# Patient Record
Sex: Female | Born: 1950 | Race: White | Hispanic: No | Marital: Single | Smoking: Never smoker
Health system: Southern US, Community
[De-identification: ages and names within clinical notes are randomized; demographics above are authoritative.]

## PROBLEM LIST (undated history)

## (undated) DIAGNOSIS — K649 Unspecified hemorrhoids: Secondary | ICD-10-CM

## (undated) DIAGNOSIS — I6529 Occlusion and stenosis of unspecified carotid artery: Secondary | ICD-10-CM

## (undated) DIAGNOSIS — M1990 Unspecified osteoarthritis, unspecified site: Secondary | ICD-10-CM

## (undated) DIAGNOSIS — Z87898 Personal history of other specified conditions: Secondary | ICD-10-CM

## (undated) DIAGNOSIS — E119 Type 2 diabetes mellitus without complications: Secondary | ICD-10-CM

## (undated) DIAGNOSIS — F32A Depression, unspecified: Secondary | ICD-10-CM

## (undated) DIAGNOSIS — Z87440 Personal history of urinary (tract) infections: Secondary | ICD-10-CM

## (undated) DIAGNOSIS — I253 Aneurysm of heart: Secondary | ICD-10-CM

## (undated) DIAGNOSIS — F1010 Alcohol abuse, uncomplicated: Secondary | ICD-10-CM

## (undated) DIAGNOSIS — Z8619 Personal history of other infectious and parasitic diseases: Secondary | ICD-10-CM

## (undated) DIAGNOSIS — R55 Syncope and collapse: Secondary | ICD-10-CM

## (undated) DIAGNOSIS — F419 Anxiety disorder, unspecified: Secondary | ICD-10-CM

## (undated) DIAGNOSIS — I2699 Other pulmonary embolism without acute cor pulmonale: Secondary | ICD-10-CM

## (undated) DIAGNOSIS — I499 Cardiac arrhythmia, unspecified: Secondary | ICD-10-CM

## (undated) DIAGNOSIS — E785 Hyperlipidemia, unspecified: Secondary | ICD-10-CM

## (undated) DIAGNOSIS — C4339 Malignant melanoma of other parts of face: Secondary | ICD-10-CM

## (undated) DIAGNOSIS — J302 Other seasonal allergic rhinitis: Secondary | ICD-10-CM

## (undated) DIAGNOSIS — R569 Unspecified convulsions: Secondary | ICD-10-CM

## (undated) DIAGNOSIS — N200 Calculus of kidney: Secondary | ICD-10-CM

## (undated) DIAGNOSIS — F329 Major depressive disorder, single episode, unspecified: Secondary | ICD-10-CM

## (undated) DIAGNOSIS — I209 Angina pectoris, unspecified: Secondary | ICD-10-CM

## (undated) HISTORY — DX: Personal history of urinary (tract) infections: Z87.440

## (undated) HISTORY — DX: Other seasonal allergic rhinitis: J30.2

## (undated) HISTORY — DX: Cardiac arrhythmia, unspecified: I49.9

## (undated) HISTORY — DX: Depression, unspecified: F32.A

## (undated) HISTORY — PX: EYE SURGERY: SHX253

## (undated) HISTORY — DX: Occlusion and stenosis of unspecified carotid artery: I65.29

## (undated) HISTORY — DX: Aneurysm of heart: I25.3

## (undated) HISTORY — DX: Major depressive disorder, single episode, unspecified: F32.9

## (undated) HISTORY — DX: Personal history of other specified conditions: Z87.898

## (undated) HISTORY — DX: Hyperlipidemia, unspecified: E78.5

## (undated) HISTORY — DX: Calculus of kidney: N20.0

## (undated) HISTORY — DX: Alcohol abuse, uncomplicated: F10.10

## (undated) HISTORY — PX: COLONOSCOPY: SHX174

## (undated) HISTORY — PX: BREAST LUMPECTOMY: SHX2

## (undated) HISTORY — DX: Personal history of other infectious and parasitic diseases: Z86.19

---

## 1997-12-26 ENCOUNTER — Ambulatory Visit (HOSPITAL_COMMUNITY): Admission: RE | Admit: 1997-12-26 | Discharge: 1997-12-26 | Payer: Self-pay | Admitting: Family Medicine

## 1998-06-20 ENCOUNTER — Other Ambulatory Visit: Admission: RE | Admit: 1998-06-20 | Discharge: 1998-06-20 | Payer: Self-pay | Admitting: Family Medicine

## 1998-08-08 ENCOUNTER — Other Ambulatory Visit: Admission: RE | Admit: 1998-08-08 | Discharge: 1998-08-08 | Payer: Self-pay | Admitting: Obstetrics and Gynecology

## 1999-02-06 ENCOUNTER — Other Ambulatory Visit: Admission: RE | Admit: 1999-02-06 | Discharge: 1999-02-06 | Payer: Self-pay | Admitting: Obstetrics and Gynecology

## 1999-07-14 ENCOUNTER — Other Ambulatory Visit: Admission: RE | Admit: 1999-07-14 | Discharge: 1999-07-14 | Payer: Self-pay | Admitting: Obstetrics and Gynecology

## 1999-08-14 ENCOUNTER — Encounter: Payer: Self-pay | Admitting: Family Medicine

## 1999-08-14 ENCOUNTER — Encounter: Admission: RE | Admit: 1999-08-14 | Discharge: 1999-08-14 | Payer: Self-pay | Admitting: Family Medicine

## 1999-08-20 ENCOUNTER — Encounter: Payer: Self-pay | Admitting: Family Medicine

## 1999-08-20 ENCOUNTER — Encounter: Admission: RE | Admit: 1999-08-20 | Discharge: 1999-08-20 | Payer: Self-pay | Admitting: Family Medicine

## 2000-05-11 ENCOUNTER — Other Ambulatory Visit: Admission: RE | Admit: 2000-05-11 | Discharge: 2000-05-11 | Payer: Self-pay | Admitting: Obstetrics and Gynecology

## 2000-08-09 ENCOUNTER — Other Ambulatory Visit: Admission: RE | Admit: 2000-08-09 | Discharge: 2000-08-09 | Payer: Self-pay | Admitting: Obstetrics and Gynecology

## 2000-08-20 ENCOUNTER — Encounter: Payer: Self-pay | Admitting: Obstetrics and Gynecology

## 2000-08-20 ENCOUNTER — Encounter: Admission: RE | Admit: 2000-08-20 | Discharge: 2000-08-20 | Payer: Self-pay | Admitting: Obstetrics and Gynecology

## 2001-09-29 ENCOUNTER — Encounter: Payer: Self-pay | Admitting: Obstetrics and Gynecology

## 2001-09-29 ENCOUNTER — Encounter: Admission: RE | Admit: 2001-09-29 | Discharge: 2001-09-29 | Payer: Self-pay | Admitting: Obstetrics and Gynecology

## 2002-09-06 ENCOUNTER — Other Ambulatory Visit: Admission: RE | Admit: 2002-09-06 | Discharge: 2002-09-06 | Payer: Self-pay | Admitting: Obstetrics and Gynecology

## 2002-12-07 ENCOUNTER — Encounter: Admission: RE | Admit: 2002-12-07 | Discharge: 2002-12-07 | Payer: Self-pay | Admitting: Obstetrics and Gynecology

## 2002-12-07 ENCOUNTER — Encounter: Payer: Self-pay | Admitting: Obstetrics and Gynecology

## 2002-12-15 ENCOUNTER — Encounter: Payer: Self-pay | Admitting: Obstetrics and Gynecology

## 2002-12-15 ENCOUNTER — Encounter: Admission: RE | Admit: 2002-12-15 | Discharge: 2002-12-15 | Payer: Self-pay | Admitting: Obstetrics and Gynecology

## 2004-04-01 ENCOUNTER — Other Ambulatory Visit: Admission: RE | Admit: 2004-04-01 | Discharge: 2004-04-01 | Payer: Self-pay | Admitting: *Deleted

## 2004-04-09 ENCOUNTER — Encounter: Admission: RE | Admit: 2004-04-09 | Discharge: 2004-04-09 | Payer: Self-pay | Admitting: Obstetrics and Gynecology

## 2004-05-22 ENCOUNTER — Ambulatory Visit: Payer: Self-pay | Admitting: Cardiology

## 2004-05-22 ENCOUNTER — Inpatient Hospital Stay (HOSPITAL_COMMUNITY): Admission: EM | Admit: 2004-05-22 | Discharge: 2004-05-25 | Payer: Self-pay | Admitting: Family Medicine

## 2004-06-02 ENCOUNTER — Ambulatory Visit: Payer: Self-pay

## 2004-06-04 ENCOUNTER — Ambulatory Visit: Payer: Self-pay | Admitting: Cardiology

## 2004-07-10 ENCOUNTER — Ambulatory Visit: Payer: Self-pay | Admitting: Cardiology

## 2004-07-14 ENCOUNTER — Inpatient Hospital Stay (HOSPITAL_COMMUNITY): Admission: AD | Admit: 2004-07-14 | Discharge: 2004-07-17 | Payer: Self-pay | Admitting: Cardiology

## 2004-07-14 ENCOUNTER — Ambulatory Visit: Payer: Self-pay | Admitting: Cardiology

## 2004-07-14 ENCOUNTER — Ambulatory Visit: Payer: Self-pay

## 2004-07-16 ENCOUNTER — Ambulatory Visit: Payer: Self-pay | Admitting: Internal Medicine

## 2004-07-31 ENCOUNTER — Ambulatory Visit: Payer: Self-pay

## 2004-08-06 ENCOUNTER — Ambulatory Visit: Payer: Self-pay | Admitting: *Deleted

## 2004-08-29 ENCOUNTER — Ambulatory Visit: Payer: Self-pay | Admitting: Cardiology

## 2004-08-29 ENCOUNTER — Ambulatory Visit: Payer: Self-pay | Admitting: Internal Medicine

## 2004-09-24 ENCOUNTER — Ambulatory Visit: Payer: Self-pay

## 2004-10-10 ENCOUNTER — Ambulatory Visit: Payer: Self-pay

## 2004-10-20 ENCOUNTER — Ambulatory Visit: Payer: Self-pay | Admitting: Cardiology

## 2004-12-30 ENCOUNTER — Ambulatory Visit: Payer: Self-pay | Admitting: Cardiology

## 2005-03-09 ENCOUNTER — Encounter: Payer: Self-pay | Admitting: Cardiology

## 2005-03-09 ENCOUNTER — Ambulatory Visit: Payer: Self-pay

## 2005-03-18 ENCOUNTER — Ambulatory Visit: Payer: Self-pay | Admitting: Cardiology

## 2005-03-26 ENCOUNTER — Ambulatory Visit: Payer: Self-pay | Admitting: Cardiology

## 2005-05-14 ENCOUNTER — Ambulatory Visit: Payer: Self-pay | Admitting: Cardiology

## 2005-05-21 ENCOUNTER — Ambulatory Visit: Payer: Self-pay | Admitting: Cardiology

## 2005-10-20 ENCOUNTER — Ambulatory Visit: Payer: Self-pay | Admitting: Cardiology

## 2006-04-01 ENCOUNTER — Ambulatory Visit: Payer: Self-pay | Admitting: Cardiology

## 2006-04-08 ENCOUNTER — Encounter: Payer: Self-pay | Admitting: Cardiology

## 2006-04-08 ENCOUNTER — Ambulatory Visit: Payer: Self-pay

## 2006-04-12 ENCOUNTER — Ambulatory Visit: Payer: Self-pay | Admitting: Cardiology

## 2006-11-11 ENCOUNTER — Ambulatory Visit: Payer: Self-pay | Admitting: Cardiology

## 2006-11-11 LAB — CONVERTED CEMR LAB
ALT: 31 units/L (ref 0–35)
AST: 42 units/L — ABNORMAL HIGH (ref 0–37)
Bilirubin, Direct: 0.1 mg/dL (ref 0.0–0.3)
Cholesterol: 189 mg/dL (ref 0–200)
HDL: 48.6 mg/dL (ref >39.0)
Total Bilirubin: 1.1 mg/dL (ref 0.3–1.2)
VLDL: 38 mg/dL (ref 0–40)

## 2006-11-18 ENCOUNTER — Ambulatory Visit: Payer: Self-pay | Admitting: Cardiology

## 2006-11-18 LAB — CONVERTED CEMR LAB: Digitoxin Lvl: 0.2 ng/mL — ABNORMAL LOW (ref 0.8–2.0)

## 2007-05-12 ENCOUNTER — Ambulatory Visit: Payer: Self-pay | Admitting: Cardiology

## 2007-05-12 LAB — CONVERTED CEMR LAB
ALT: 34 units/L (ref 0–35)
AST: 42 units/L — ABNORMAL HIGH (ref 0–37)
LDL Cholesterol: 100 mg/dL — ABNORMAL HIGH (ref 0–99)
Total Bilirubin: 1.1 mg/dL (ref 0.3–1.2)

## 2007-05-19 ENCOUNTER — Encounter: Admission: RE | Admit: 2007-05-19 | Discharge: 2007-05-19 | Payer: Self-pay | Admitting: Internal Medicine

## 2007-05-19 ENCOUNTER — Ambulatory Visit: Payer: Self-pay | Admitting: Cardiology

## 2007-11-24 ENCOUNTER — Ambulatory Visit: Payer: Self-pay | Admitting: Cardiology

## 2007-11-24 LAB — CONVERTED CEMR LAB
ALT: 25 units/L (ref 0–35)
AST: 33 units/L (ref 0–37)
Alkaline Phosphatase: 68 units/L (ref 39–117)
LDL Cholesterol: 105 mg/dL — ABNORMAL HIGH (ref 0–99)
Total Bilirubin: 0.7 mg/dL (ref 0.3–1.2)
Total CHOL/HDL Ratio: 4.3
VLDL: 33 mg/dL (ref 0–40)

## 2007-11-30 ENCOUNTER — Ambulatory Visit: Payer: Self-pay | Admitting: Cardiology

## 2008-04-06 DIAGNOSIS — C4339 Malignant melanoma of other parts of face: Secondary | ICD-10-CM

## 2008-04-06 HISTORY — DX: Malignant melanoma of other parts of face: C43.39

## 2008-04-06 HISTORY — PX: MELANOMA EXCISION: SHX5266

## 2008-05-31 ENCOUNTER — Ambulatory Visit: Payer: Self-pay

## 2008-05-31 ENCOUNTER — Encounter: Payer: Self-pay | Admitting: Cardiology

## 2008-05-31 ENCOUNTER — Ambulatory Visit: Payer: Self-pay | Admitting: Cardiology

## 2008-05-31 LAB — CONVERTED CEMR LAB
AST: 42 units/L — ABNORMAL HIGH (ref 0–37)
Alkaline Phosphatase: 67 units/L (ref 39–117)
Bilirubin, Direct: 0.1 mg/dL (ref 0.0–0.3)
HDL: 56.4 mg/dL (ref >39.0)
Total Bilirubin: 0.7 mg/dL (ref 0.3–1.2)
Total Protein: 6.8 g/dL (ref 6.0–8.3)
Triglycerides: 156 mg/dL — ABNORMAL HIGH (ref 0–149)
VLDL: 31 mg/dL (ref 0–40)

## 2008-06-07 ENCOUNTER — Ambulatory Visit: Payer: Self-pay | Admitting: Cardiology

## 2008-08-07 ENCOUNTER — Encounter: Payer: Self-pay | Admitting: Cardiology

## 2008-12-06 ENCOUNTER — Encounter (INDEPENDENT_AMBULATORY_CARE_PROVIDER_SITE_OTHER): Payer: Self-pay | Admitting: *Deleted

## 2009-01-31 ENCOUNTER — Telehealth: Payer: Self-pay | Admitting: Cardiology

## 2009-02-11 ENCOUNTER — Telehealth: Payer: Self-pay | Admitting: Cardiology

## 2009-03-20 DIAGNOSIS — Z8659 Personal history of other mental and behavioral disorders: Secondary | ICD-10-CM

## 2009-03-20 DIAGNOSIS — F1010 Alcohol abuse, uncomplicated: Secondary | ICD-10-CM | POA: Insufficient documentation

## 2009-03-20 DIAGNOSIS — Z8669 Personal history of other diseases of the nervous system and sense organs: Secondary | ICD-10-CM | POA: Insufficient documentation

## 2009-03-20 DIAGNOSIS — E7800 Pure hypercholesterolemia, unspecified: Secondary | ICD-10-CM

## 2009-03-20 DIAGNOSIS — Z8679 Personal history of other diseases of the circulatory system: Secondary | ICD-10-CM

## 2009-03-26 ENCOUNTER — Ambulatory Visit: Payer: Self-pay | Admitting: Cardiology

## 2009-03-28 LAB — CONVERTED CEMR LAB
ALT: 26 units/L (ref 0–35)
AST: 41 units/L — ABNORMAL HIGH (ref 0–37)
Albumin: 3.9 g/dL (ref 3.5–5.2)
Cholesterol: 187 mg/dL (ref 0–200)
LDL Cholesterol: 90 mg/dL (ref 0–99)
Total Protein: 6.9 g/dL (ref 6.0–8.3)
Triglycerides: 152 mg/dL — ABNORMAL HIGH (ref 0.0–149.0)

## 2009-04-02 ENCOUNTER — Ambulatory Visit: Payer: Self-pay | Admitting: Cardiology

## 2009-04-25 ENCOUNTER — Encounter (INDEPENDENT_AMBULATORY_CARE_PROVIDER_SITE_OTHER): Payer: Self-pay | Admitting: *Deleted

## 2009-05-14 ENCOUNTER — Encounter (INDEPENDENT_AMBULATORY_CARE_PROVIDER_SITE_OTHER): Payer: Self-pay | Admitting: *Deleted

## 2009-05-16 ENCOUNTER — Ambulatory Visit: Payer: Self-pay | Admitting: Internal Medicine

## 2009-05-30 ENCOUNTER — Ambulatory Visit: Payer: Self-pay | Admitting: Internal Medicine

## 2009-11-22 ENCOUNTER — Ambulatory Visit: Payer: Self-pay | Admitting: Cardiology

## 2009-11-28 ENCOUNTER — Ambulatory Visit: Payer: Self-pay | Admitting: Cardiology

## 2009-11-28 DIAGNOSIS — I4891 Unspecified atrial fibrillation: Secondary | ICD-10-CM

## 2009-11-29 LAB — CONVERTED CEMR LAB
AST: 32 units/L (ref 0–37)
Albumin: 4.1 g/dL (ref 3.5–5.2)
HDL: 61 mg/dL (ref >39.00)
LDL Cholesterol: 98 mg/dL (ref 0–99)
Total CHOL/HDL Ratio: 3
Triglycerides: 155 mg/dL — ABNORMAL HIGH (ref 0.0–149.0)
VLDL: 31 mg/dL (ref 0.0–40.0)

## 2010-04-27 ENCOUNTER — Encounter: Payer: Self-pay | Admitting: Internal Medicine

## 2010-05-06 NOTE — Assessment & Plan Note (Signed)
Summary: f59m   Visit Type:  6 months follow up  CC:  No cardiac complains.  History of Present Illness: She is doing well.  She is taking her medications.  No specific problems.  Still not taking good care of herself.   She has a therapist.  Seeing Dr. Raquel James, who is being strict.  No chest pain.  No clinical suggestion of being out of rhythm.  Current Medications (verified): 1)  Flecainide Acetate 50 Mg Tabs (Flecainide Acetate) .... Take 2 Tablets Twice Daily 2)  Digoxin 0.125 Mg Tabs (Digoxin) .... Take One Tablet By Mouth Daily 3)  Crestor 20 Mg Tabs (Rosuvastatin Calcium) .... Take One-Half  Tablet By Mouth Daily. 4)  Aspirin Ec 325 Mg Tbec (Aspirin) .... Take One Tablet By Mouth Daily 5)  Multivitamins  Tabs (Multiple Vitamin) .... Take 1 Tablet By Mouth Once A Day 6)  Dilt-Cd 240 Mg Xr24h-Cap (Diltiazem Hcl Coated Beads) .... Take 1 Capsule By Mouth Once A Day 7)  Sertraline Hcl 50 Mg Tabs (Sertraline Hcl) .... Take 1 Tablet By Mouth Once A Day  Allergies: 1)  ! * Cephalosporins  Vital Signs:  Patient profile:   60 year old female Height:      63 inches Weight:      162.75 pounds BMI:     28.93 Pulse rate:   71 / minute Pulse rhythm:   regular Resp:     18 per minute BP sitting:   122 / 70  (left arm) Cuff size:   large  Vitals Entered By: Vikki Ports (November 28, 2009 3:57 PM)  Physical Exam  General:  Well developed, well nourished, in no acute distress. Head:  normocephalic and atraumatic Lungs:  Clear bilaterally to auscultation and percussion. Heart:  NormalS1 and S2.  S4.   Msk:  Back normal, normal gait. Muscle strength and tone normal. Pulses:  pulses normal in all 4 extremities Extremities:  No clubbing or cyanosis. Neurologic:  Alert and oriented x 3.   EKG  Procedure date:  11/28/2009  Findings:      NSR with first degree av block.  Possible LAE.  QTc 453.  Impression & Recommendations:  Problem # 1:  ATRIAL FIBRILLATION (ICD-427.31) no  obvious recurrence.  Maintaining meds without difficulty, tolerating without symptoms Her updated medication list for this problem includes:    Flecainide Acetate 50 Mg Tabs (Flecainide acetate) .Marland Kitchen... Take 2 tablets twice daily    Digoxin 0.125 Mg Tabs (Digoxin) .Marland Kitchen... Take one tablet by mouth daily    Aspirin Ec 325 Mg Tbec (Aspirin) .Marland Kitchen... Take one tablet by mouth daily  Her updated medication list for this problem includes:    Flecainide Acetate 50 Mg Tabs (Flecainide acetate) .Marland Kitchen... Take 2 tablets twice daily    Digoxin 0.125 Mg Tabs (Digoxin) .Marland Kitchen... Take one tablet by mouth daily    Aspirin Ec 325 Mg Tbec (Aspirin) .Marland Kitchen... Take one tablet by mouth daily  Problem # 2:  HYPERCHOLESTEROLEMIA (ICD-272.0)  labs revieiwed. Her updated medication list for this problem includes:    Crestor 20 Mg Tabs (Rosuvastatin calcium) .Marland Kitchen... Take one-half  tablet by mouth daily.  Her updated medication list for this problem includes:    Crestor 20 Mg Tabs (Rosuvastatin calcium) .Marland Kitchen... Take one-half  tablet by mouth daily.  Problem # 3:  ALCOHOL ABUSE (ICD-305.00) still has been a bit of an issue. See psychiatry for this.     Other Orders: EKG w/ Interpretation (93000)  Patient Instructions: 1)  Your physician recommends that you continue on your current medications as directed. Please refer to the Current Medication list given to you today. 2)  Your physician wants you to follow-up in:  6 months. You will receive a reminder letter in the mail two months in advance. If you don't receive a letter, please call our office to schedule the follow-up appointment.

## 2010-05-06 NOTE — Procedures (Signed)
Summary: Colonoscopy  Patient: [name] Note: All result statuses are Final unless otherwise noted.  Tests: (1) Colonoscopy (COL)   COL Colonoscopy           DONE     [facility]     [address]     [place], [place]  [address]           COLONOSCOPY PROCEDURE REPORT           PATIENT:  Traci, [name]  MR#:  [id number]     BIRTHDATE:  [birth year], 58 yrs. old  GENDER:  female           ENDOSCOPIST:  Hedwig Morton. Juanda Chance, MD     Referred by:  Raechel Chute, M.D.           PROCEDURE DATE:  05/30/2009     PROCEDURE:  Colonoscopy 19147     ASA CLASS:  Class I     INDICATIONS:  Routine Risk Screening           MEDICATIONS:   Versed 4 mg, Fentanyl 50 mcg           DESCRIPTION OF PROCEDURE:   After the risks benefits and     alternatives of the procedure were thoroughly explained, informed     consent was obtained.  Digital rectal exam was performed and     revealed no rectal masses.   The LB [id number] endoscope     was introduced through the anus and advanced to the cecum, which     was identified by both the appendix and ileocecal valve, without     limitations.  The quality of the prep was good, using MiraLax.     The instrument was then slowly withdrawn as the colon was fully     examined.     <<PROCEDUREIMAGES>>           FINDINGS:  No polyps or cancers were seen (see image1, image2,     image3, and image5).  Mild diverticulosis was found (see image4).     only one diverticulum seen in the left colon   Retroflexed views     in the rectum revealed no abnormalities.    The scope was then     withdrawn from the patient and the procedure completed.           COMPLICATIONS:  None           ENDOSCOPIC IMPRESSION:     1) No polyps or cancers     2) Mild diverticulosis     RECOMMENDATIONS:     1) High fiber diet.           REPEAT EXAM:  In 10 year(s) for.           ______________________________     Hedwig Morton. Juanda Chance, MD           CC:            n.     eSIGNED:   Hedwig Morton. Artice Bergerson at 05/30/2009 09:40 AM           Theora Master, [phone]  Note: An exclamation mark (!) indicates a result that was not dispersed into the flowsheet. Document Creation Date: 05/30/2009 10:36 AM _______________________________________________________________________  (1) Order result status: Final Collection or observation date-time: 05/30/2009 09:35 Requested date-time:  Receipt date-time:  Reported date-time:  Referring Physician:   Ordering Physician: Lina Sar [phone]) Specimen Source:  Source: Launa Grill Order Number: [id number]  Lab site:   Appended Document: Colonoscopy    Clinical Lists Changes  Observations: Added new observation of COLONNXTDUE: 05/2019 (05/30/2009 11:18)

## 2010-05-06 NOTE — Letter (Signed)
Summary: Previsit letter  [facility]  [address] [place], [place] [address]   Phone: [phone]  Fax: [phone]       04/25/2009 MRN: [id number]  [name] [address] [place], [place]  [address]  Dear [name],  Welcome to the Gastroenterology Division at [facility].    You are scheduled to see a nurse for your pre-procedure visit on 05-16-09 at 10:00a.m. on the 3rd floor at [facility], [address]  We ask that you try to arrive at our office 15 minutes prior to your appointment time to allow for check-in.  Your nurse visit will consist of discussing your medical and surgical history, your immediate family medical history, and your medications.    Please bring a complete list of all your medications or, if you prefer, bring the medication bottles and we will list them.  We will need to be aware of both prescribed and over the counter drugs.  We will need to know exact dosage information as well.  If you are on blood thinners (Coumadin, Plavix, Aggrenox, Ticlid, etc.) please call our office today/prior to your appointment, as we need to consult with your physician about holding your medication.   Please be prepared to read and sign documents such as consent forms, a financial agreement, and acknowledgement forms.  If necessary, and with your consent, a friend or relative is welcome to sit-in on the nurse visit with you.  Please bring your insurance card so that we may make a copy of it.  If your insurance requires a referral to see a specialist, please bring your referral form from your primary care physician.  No co-pay is required for this nurse visit.     If you cannot keep your appointment, please call [phone] to cancel or reschedule prior to your appointment date.  This allows [place] the opportunity to schedule an appointment for another patient in need of care.    Thank you for choosing [facility] for your  medical needs.  We appreciate the opportunity to care for you.  Please visit [place] at our website  to learn more about our practice.                     Sincerely.                                                                                                                   The Gastroenterology Division

## 2010-05-06 NOTE — Miscellaneous (Signed)
Summary: [facility] Previsit/prep  Clinical Lists Changes  Medications: Added new medication of MIRALAX   POWD (POLYETHYLENE GLYCOL 3350) As per prep  instructions. - Signed Added new medication of METOCLOPRAMIDE HCL 10 MG  TABS (METOCLOPRAMIDE HCL) As per prep instructions. - Signed Added new medication of DULCOLAX 5 MG  TBEC (BISACODYL) Day before procedure take 2 at 3pm and 2 at 8pm. - Signed Rx of MIRALAX   POWD (POLYETHYLENE GLYCOL 3350) As per prep  instructions.;  #255gm x 0;  Signed;  Entered by: Wyona Almas RN;  Authorized by: Hart Carwin MD;  Method used: Electronically to [facility]*, [address], [place], [place]  [address], Ph: [phone], Fax: [phone] Rx of METOCLOPRAMIDE HCL 10 MG  TABS (METOCLOPRAMIDE HCL) As per prep instructions.;  #2 x 0;  Signed;  Entered by: Wyona Almas RN;  Authorized by: Hart Carwin MD;  Method used: Electronically to [facility]*, [address] [place], [place]  [address], Ph: [phone], Fax: [phone] Rx of DULCOLAX 5 MG  TBEC (BISACODYL) Day before procedure take 2 at 3pm and 2 at 8pm.;  #4 x 0;  Signed;  Entered by: Wyona Almas RN;  Authorized by: Hart Carwin MD;  Method used: Electronically to [facility]*, [address], [place], [place]  [id number], Ph: [phone], Fax: [phone] Allergies: Added new allergy or adverse reaction of SULFA Observations: Added new observation of NKA: F (05/16/2009 9:43)    Prescriptions: DULCOLAX 5 MG  TBEC (BISACODYL) Day before procedure take 2 at 3pm and 2 at 8pm.  #4 x 0   Entered by:   Wyona Almas RN   Authorized by:   Hart Carwin MD   Signed by:   Wyona Almas RN on 05/16/2009   Method used:   Electronically to        [facility]* (retail)       [address]       [place], [place]  [id number]       Ph: [phone]       Fax: [phone]   RxID:   [id number] METOCLOPRAMIDE HCL 10 MG  TABS (METOCLOPRAMIDE  HCL) As per prep instructions.  #2 x 0   Entered by:   Wyona Almas RN   Authorized by:   Hart Carwin MD   Signed by:   Wyona Almas RN on 05/16/2009   Method used:   Electronically to        [facility]* (retail)       [address]       [place], [place]  [address]       Ph: [phone]       Fax: [phone]   RxID:   [id number] MIRALAX   POWD (POLYETHYLENE GLYCOL 3350) As per prep  instructions.  #255gm x 0   Entered by:   Wyona Almas RN   Authorized by:   Hart Carwin MD   Signed by:   Wyona Almas RN on 05/16/2009   Method used:   Electronically to        [facility]* (retail)       [address]       [place], [place]  [address]       Ph: [phone]       Fax: [phone]   RxID:   [id number]

## 2010-05-06 NOTE — Letter (Signed)
Summary: [facility] Instructions  [facility]  [address] [place], [place] [address]   Phone: [phone]  Fax: [phone]       [name]    Mar 13, 1951    MRN: [id number]       Procedure Day /Date:  Thursday 05/30/09     Arrival Time:   8:00am     Procedure Time:  9:00am     Location of Procedure:                    [name]  [facility] (4th Floor)    PREPARATION FOR COLONOSCOPY WITH MIRALAX  Starting 5 days prior to your procedure   Saturday 02/19  do not eat nuts, seeds, popcorn, corn, beans, peas,  salads, or any raw vegetables.  Do not take any fiber supplements (e.g. Metamucil, Citrucel, and Benefiber). ____________________________________________________________________________________________________   THE DAY BEFORE YOUR PROCEDURE         DATE:  02/23  DAY: Wednesday  1   Drink clear liquids the entire day-NO SOLID FOOD  2   Do not drink anything colored red or purple.  Avoid juices with pulp.  No orange juice.  3   Drink at least 64 oz. (8 glasses) of fluid/clear liquids during the day to prevent dehydration and help the prep work efficiently.  CLEAR LIQUIDS INCLUDE: Water Jello Ice Popsicles Tea (sugar ok, no milk/cream) Powdered fruit flavored drinks Coffee (sugar ok, no milk/cream) Gatorade Juice: apple, white grape, white cranberry  Lemonade Clear bullion, consomm, broth Carbonated beverages (any kind) Strained chicken noodle soup Hard Candy  4   Mix the entire bottle of Miralax with 64 oz. of Gatorade/Powerade in the morning and put in the refrigerator to chill.  5   At 3:00 pm take 2 Dulcolax/Bisacodyl tablets.  6   At 4:30 pm take one Reglan/Metoclopramide tablet.  7  Starting at 5:00 pm drink one 8 oz glass of the Miralax mixture every 15-20 minutes until you have finished drinking the entire 64 oz.  You should finish drinking prep around 7:30 or 8:00 pm.  8   If you are nauseated, you may take the 2nd  Reglan/Metoclopramide tablet at 6:30 pm.        9    At 8:00 pm take 2 more DULCOLAX/Bisacodyl tablets.     THE DAY OF YOUR PROCEDURE      DATE:   02/24  DAY: Thursday  You may drink clear liquids until  7:00am   (2 HOURS BEFORE PROCEDURE).   MEDICATION INSTRUCTIONS  Unless otherwise instructed, you should take regular prescription medications with a small sip of water as early as possible the morning of your procedure.    Additional medication instructions:  Be sure to take morning medications the morning of procedure.         OTHER INSTRUCTIONS  You will need a responsible adult at least 60 years of age to accompany you and drive you home.   This person must remain in the waiting room during your procedure.  Wear loose fitting clothing that is easily removed.  Leave jewelry and other valuables at home.  However, you may wish to bring a book to read or an iPod/MP3 player to listen to music as you wait for your procedure to start.  Remove all body piercing jewelry and leave at home.  Total time from sign-in until discharge is approximately 2-3 hours.  You should go home directly after your procedure  and rest.  You can resume normal activities the day after your procedure.  The day of your procedure you should not:   Drive   Make legal decisions   Operate machinery   Drink alcohol   Return to work  You will receive specific instructions about eating, activities and medications before you leave.   The above instructions have been reviewed and explained to me by   Traci Almas RN  February 60, 2011 10:25 AM     I fully understand and can verbalize these instructions _____________________________ Date _______

## 2010-06-18 ENCOUNTER — Ambulatory Visit (INDEPENDENT_AMBULATORY_CARE_PROVIDER_SITE_OTHER): Payer: BC Managed Care – PPO | Admitting: Cardiology

## 2010-06-18 ENCOUNTER — Encounter: Payer: Self-pay | Admitting: Cardiology

## 2010-06-18 DIAGNOSIS — Z8679 Personal history of other diseases of the circulatory system: Secondary | ICD-10-CM

## 2010-06-18 DIAGNOSIS — E7800 Pure hypercholesterolemia, unspecified: Secondary | ICD-10-CM

## 2010-06-18 DIAGNOSIS — I4891 Unspecified atrial fibrillation: Secondary | ICD-10-CM

## 2010-07-03 NOTE — Assessment & Plan Note (Signed)
Summary: f/u  ov   Visit Type:  Follow-up  CC:  no complaints.  History of Present Illness: She says she is doing well.  Still not as good as alcohol should be, but still drinks too much.  She does not smoke.  She does not exercise at all really.  She sits way too much.  Denies dizziness, and denies chest pain.    Problems Prior to Update: 1)  Atrial Fibrillation  (ICD-427.31) 2)  Encounter For Long-term Use of Other Medications  (ICD-V58.69) 3)  Atrial Fibrillation, Hx of  (ICD-V12.59) 4)  Hypercholesterolemia  (ICD-272.0) 5)  Vertigo, Hx of  (ICD-V12.49) 6)  Depression, Hx of  (ICD-V11.8) 7)  Alcohol Abuse  (ICD-305.00)  Current Medications (verified): 1)  Flecainide Acetate 50 Mg Tabs (Flecainide Acetate) .... Take 2 Tablets Twice Daily 2)  Digoxin 0.125 Mg Tabs (Digoxin) .... Take One Tablet By Mouth Daily 3)  Crestor 20 Mg Tabs (Rosuvastatin Calcium) .... Take One-Half  Tablet By Mouth Daily. 4)  Aspirin Ec 325 Mg Tbec (Aspirin) .... Take One Tablet By Mouth Daily 5)  Multivitamins  Tabs (Multiple Vitamin) .... Take 1 Tablet By Mouth Once A Day 6)  Dilt-Cd 240 Mg Xr24h-Cap (Diltiazem Hcl Coated Beads) .... Take 1 Capsule By Mouth Once A Day 7)  Sertraline Hcl 100 Mg Tabs (Sertraline Hcl) .Marland Kitchen.. 1 Tablet Daily  Allergies (verified): 1)  ! * Cephalosporins  Past History:  Past Medical History: Last updated: 03/20/2009 Current Problems:  ATRIAL FIBRILLATION, HX OF (ICD-V12.59) HYPERCHOLESTEROLEMIA (ICD-272.0) VERTIGO, HX OF (ICD-V12.49) DEPRESSION, HX OF (ICD-V11.8) ALCOHOL ABUSE (ICD-305.00) .Atrial septal aneurysm  Past Surgical History: Last updated: 03/20/2009  None.  Family History: Last updated: 03/20/2009  Her mother is alive and has no history of heart  disease but does have a pacemaker.  Her father died in his 49s, but she thinks it may have been heart disease.  She has a sister with no history of heart disease.  Social History: Last updated: 03/20/2009  She lives alone in [place] and works at the [employer]. She does not smoke and never has. She does not use drugs. She states she drinks beer daily and goes through about two cases a week.  Vital Signs:  Patient profile:   60 year old female Height:      63 inches Weight:      161.50 pounds BMI:     28.71 Pulse rate:   67 / minute BP sitting:   108 / 64  (left arm) Cuff size:   regular  Vitals Entered By: Caralee Ates CMA (June 18, 2010 9:50 AM)  Physical Exam  General:  Well developed, well nourished, in no acute distress. Head:  normocephalic and atraumatic Eyes:  PERRLA/EOM intact; conjunctiva and lids normal. Neck:  No JVD Msk:  Scoliosis. reviewed with patient in detail. Extremities:  No clubbing or cyanosis. Neurologic:  Alert and oriented x 3.   EKG  Procedure date:  06/18/2010  Findings:      NSR.  First degree av block.  QTc is normal.    Impression & Recommendations:  Problem # 1:  ATRIAL FIBRILLATION (ICD-427.31)  maintaining NSR on Flecainide.  Will do routine GXT to exclude ischemia.   Her updated medication list for this problem includes:    Flecainide Acetate 50 Mg Tabs (Flecainide acetate) .Marland Kitchen... Take 2 tablets twice daily    Digoxin 0.125 Mg Tabs (Digoxin) .Marland Kitchen... Take one tablet by mouth daily    Aspirin Ec  325 Mg Tbec (Aspirin) .Marland Kitchen... Take one tablet by mouth daily  Orders: EKG w/ Interpretation (93[address])  Her updated medication list for this problem includes:    Flecainide Acetate 50 Mg Tabs (Flecainide acetate) .Marland Kitchen... Take 2 tablets twice daily    Digoxin 0.125 Mg Tabs (Digoxin) .Marland Kitchen... Take one tablet by mouth daily    Aspirin Ec 325 Mg Tbec (Aspirin) .Marland Kitchen... Take one tablet by mouth daily  Problem # 2:  HYPERCHOLESTEROLEMIA (ICD-272.0) on treatment.  Not at target. Uses alcohol and has had problems with borderline LFT in past.   Will leave as at present.   Her updated medication list for this problem includes:    Crestor 20 Mg Tabs (Rosuvastatin  calcium) .Marland Kitchen... Take one-half  tablet by mouth daily.  Problem # 3:  DEPRESSION, HX OF (ICD-V11.8) Continues to seek counseling for issues, including alcohol use.  She freely admits it is still a problem, says she perhaps at times gets lonely.  We continue to discuss this.    Patient Instructions: 1)  Your physician recommends that you continue on your current medications as directed. Please refer to the Current Medication list given to you today. 2)  Your physician has requested that you have an exercise tolerance test in 6 MONTHS.  For further information please visit [email or web address]  Please also follow instruction sheet, as given.

## 2010-07-08 ENCOUNTER — Other Ambulatory Visit: Payer: Self-pay | Admitting: *Deleted

## 2010-07-08 MED ORDER — FLECAINIDE ACETATE 50 MG PO TABS: ORAL_TABLET | ORAL | Status: DC

## 2010-08-13 ENCOUNTER — Other Ambulatory Visit: Payer: Self-pay | Admitting: Cardiology

## 2010-08-19 NOTE — Assessment & Plan Note (Signed)
[facility]                            CARDIOLOGY OFFICE NOTE   Traci, [name]                    MRN:          [id number]  DATE:06/07/2008                            DOB:          03/23/1951    [name] is in for followup.  She has not been out of rhythm, and  she has not had any chest pain.  She thinks she is doing better with her  alcohol to some extent and she has been placed on an antidepressant by  Dr. Raquel James.  Otherwise, she is free of significant symptoms.   MEDICATIONS:  1. Aspirin 325 mg daily.  2. Digoxin 0.125 mg daily.  3. Multivitamin.  4. Flecainide 50 mg 2 tablets p.o. b.i.d.  5. Crestor 20 mg daily.  6. Cardizem CD 240 mg daily.  7. Sertraline 50 mg daily.   On physical, she is alert and oriented in no distress.  Weight is 164  pounds up 2 pounds from the last office visit in August.  Blood pressure  is 122/68, the pulse is 72.  The lung fields actually are relatively  clear.  There is no demonstrable edema.   Last echocardiogram in January 2008 suggested an atrial septal aneurysm.  Repeat echocardiogram at this time does not so much change with an  overall ejection fraction of 55-65%.  Right ventricular size and  function was thought to be normal.   Today's electrocardiogram demonstrates normal sinus rhythm, QT interval  was 435 milliseconds, the PR interval was 218 milliseconds.   IMPRESSION:  1. Hypercholesterolemia on lipid-lowering therapy.  2. History of atrial fibrillation.  3. Atrial septal aneurysm on aspirin.   PLAN:  1. Return to clinic in 6 months to 1 year.  2. Continue counsel regarding alcohol intake.     Arturo Morton. Riley Kill, MD, [facility]  Electronically Signed    TDS/MedQ  DD: 06/17/2008  DT: 06/18/2008  Job #: [id number]

## 2010-08-19 NOTE — Assessment & Plan Note (Signed)
[facility]                            CARDIOLOGY OFFICE NOTE   DERIONNA, SALVADOR                    MRN:          [id number]  DATE:11/18/2006                            DOB:          July 23, 1950    Ms. [name] is in for follow-up.  From a cardiac standpoint, she really  has gotten along well.  She is continuing to exercise on a regular  basis.  She has not been having any major symptoms.  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  We did have a frank  discussion today about some of her habits.  She clearly understands that  she needs to exercise more.  She says that she drinks on more days than  not, sometimes up to five to six beers an evening.  I have had a long  thorough discussion about that and I have encouraged her to seek some  resolution to this over time whether it be with help or other type of  intervention.  She is coherent and clearly has the understanding to  understand this.   CURRENT MEDICATIONS:  1. Aspirin 325 mg daily.  2. Digoxin 0.125 mg daily.  3. Effexor 137.5 mg p.o. b.i.d.  4. Multivitamin daily.  5. Flecainide 50 mg two tablets b.i.d.  6. Crestor 10 mg nightly.  7. Cardizem 240 mg daily.   PHYSICAL EXAMINATION:  VITAL SIGNS:  Blood pressure 124/70, pulse 84.  GENERAL APPEARANCE:  She is an alert and oriented female in no acute  distress.  LUNGS:  The lung fields are clear.  CARDIOVASCULAR:  Regular rhythm.  There is a soft systolic ejection  murmur with no diastolic murmurs.  EXTREMITIES:  No edema.   LABORATORY DATA:  Recent laboratory studies include an LDL with  cholesterol 102, triglycerides 190 and total cholesterol 189.   The patient's electrocardiogram demonstrates normal sinus rhythm with  first degree AV block.  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  PR interval has not changed substantially and was 206  msec on the previous tracing and the intervals  have otherwise not  changed as well.   IMPRESSION:  1. History of atrial fibrillation currently on flecainide maintenance      prophylaxis.  2. Hypercholesterolemia on lipid lowering therapy with desire not to      change dose on the part of the patient.  3. Frequent alcohol use with thorough discussion regarding the above.   PLAN:  1. Return to clinic in six months.  2. Digoxin and flecainide levels.  3. Discussion regarding her cholesterol lowering agents, what the      targets are and what the long-term plan is.     Arturo Morton. Riley Kill, MD, [facility]  Electronically Signed    TDS/MedQ  DD: 11/18/2006  DT: 11/19/2006  Job #: [id number]

## 2010-08-19 NOTE — Assessment & Plan Note (Signed)
[facility]                            CARDIOLOGY OFFICE NOTE   [name], [name]                    MRN:          [id number]  DATE:05/19/2007                            DOB:          [birth year]    Ms. Rendall is in for follow-up.  In general she has been stable.  She  sees Dr. Constance Goltz at [facility].  She is also seeing Dr. Jamas Lav for adjustment of her other medications.  She denies any ongoing  chest pain or progressive shortness of breath.  We continue discuss  today some of her habits.   CURRENT MEDICATIONS:  Aspirin 325 mg daily, digoxin 0.125 mg daily,  multivitamin daily, flecainide 50 mg 2 tablets b.i.d., Crestor 10 mg  q.h.s. and Cardizem CD 240 mg daily.   PHYSICAL EXAMINATION:  She is alert and oriented in no distress.  Blood pressure is 114/75, the pulse is 70.  The weight is 171 pounds,  which is slightly less than the past.  Her cardiac rhythm is regular.  There is a soft systolic ejection murmur  as previously noted.  No diastolic murmurs are appreciated.   The electrocardiogram demonstrates normal sinus rhythm with first-degree  AV block.  There is possible left atrial enlargement and a rightward  oriented axis.  There is a low voltage QRS.   Importantly, the patient has had prior echocardiography.  The last  echocardiogram in January 2008 revealed normal LV function.  Right-sided  structures were not significantly increased.  There was a suggestion of  an atrial septal aneurysm.   IMPRESSION:  1. Paroxysmal atrial fibrillation on flecainide.  2. Hypercholesterolemia on lipid lowering therapy.   RECOMMENDATIONS:  1. We will continue to follow the patient.  2. She will need periodic echocardiography.  3. I have continued to counsel her use of alcohol intake.  No      definitive evidence of any type of left-to-right shunt, although      this cannot be entirely excluded.     Arturo Morton. Riley Kill, MD, [facility]  Electronically Signed    TDS/MedQ  DD: 05/29/2007  DT: 05/29/2007  Job #: [id number]

## 2010-08-19 NOTE — Assessment & Plan Note (Signed)
[facility]                            CARDIOLOGY OFFICE NOTE   Nelda, Luckey ALLYSEN LAZO                    MRN:          [id number]  DATE:11/30/2007                            DOB:          [birth year]    Ms. Kuwahara is in for followup.  In general, she is stable.  She has  not been having any ongoing chest pain or progressive shortness of  breath.  She does continue to use a little bit of alcohol at night.  She  had been on antidepressants since Christmas at which time, she had kind  of a difficult time, but she says that her alcohol content had lessened,  and she feels overall improved.  School has restarted this week.   CURRENT MEDICATIONS:  Include:  1. Aspirin 325 mg daily.  2. Digoxin 0.125 mg daily.  3. Multivitamin 1 daily.  4. Flecainide 50 mg 2 tablets p.o. b.i.d.  5. Crestor 20 mg nightly.  6. Cardizem CD 240 mg daily.  7. Sertraline 50 mg daily.   PHYSICAL EXAMINATION:  GENERAL:  She is alert and oriented, in no  distress.  VITAL SIGNS:  Blood pressure is 120/70, pulse is 68.  LUNGS:  Lung fields are clear.  CARDIAC:  Reveals a normal first and second heart sound.  There is a  minimal systolic ejection murmur.   The electrocardiogram demonstrates sinus rhythm with borderline first-  degree AV block, PR interval of 230 milliseconds, the QTC is 438.  No  definite acute changes are noted.   The patient received electrophysiology.  She has paroxysmal atrial  fibrillation and has had flutter as well but is on flecainide.  She has  hypercholesterolemia but is currently on lipid-lowering therapy.  She  has had an issue with alcohol use in the past which we have tried to  encourage her to diminish.  We will see her back in followup in 6  months, and at that time, a repeat echocardiogram will be recommended.     Arturo Morton. Riley Kill, MD, [facility]  Electronically Signed    TDS/MedQ  DD: 11/30/2007  DT: 12/01/2007  Job #: [id number]

## 2010-08-19 NOTE — Assessment & Plan Note (Signed)
[facility]                            CARDIOLOGY OFFICE NOTE   ISSA, LUSTER                    MRN:          [id number]  DATE:06/07/2008                            DOB:          11-18-1950    [name] is in for followup.  In general, she is doing extremely  well.  She denies any ongoing chest pain or shortness of breath.  She  has been on antidepressants, and as a result working with her  counselors.  Her alcohol use is less; however, it has not completely  gone and she is using some nonalcoholic beer from time to time now.   Repeat 2-D echocardiogram was done this week.  EF was 55-65% and right-  sided structures were normal.  The right ventricle was not visualized  well however, but size was felt to be normal.  Of note, there was  thought to be an atrial septal aneurysm.  The atrial septal aneurysm was  seen on 2-D.   In addition, she had lipid and liver done.  Liver functions were for the  most part normal, although the SGOT was minimally elevated at 42,  cholesterols 203, triglycerides 156, and direct LDL 124.   PHYSICAL EXAMINATION:  GENERAL:  Today, she is alert and oriented.  No  distress.  She is pleasant.  VITAL SIGNS:  Her weight is 164, up 2 pounds from the previous visit.  Blood pressure 122/68 and pulse 72.  LUNGS:  Fields are clear.  CARDIAC:  Rhythm was regular with a minimal systolic ejection murmur.   The electrocardiogram demonstrates sinus rhythm with a first-degree AV  block with PR intervals 218 msec, QTC is 435.   IMPRESSION:  1. History of paroxysmal atrial fibrillation, currently controlled on      flecainide.  2. Hypercholesterolemia, on lipid-lowering therapy, not at target.  3. 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  4. History of alcohol use.   PLAN:  1. Return to clinic in 6 months.  2. Continue current medical regimen.  3. I reemphasized efforts to quit.  4. Continue  long-term therapy with aspirin given the patient's atrial      fib and atrial septal aneurysm.     Arturo Morton. Riley Kill, MD, [facility]  Electronically Signed   TDS/MedQ  DD: 06/07/2008  DT: 06/07/2008  Job #: [id number]

## 2010-08-22 NOTE — Assessment & Plan Note (Signed)
[facility]                            CARDIOLOGY OFFICE NOTE   GRISSEL, TYRELL                    MRN:          [id number]  DATE:04/12/2006                            DOB:          09/07/1950    Ms. Nuzzo is in for a followup visit.  To briefly summarize, she is  getting along reasonably well.  She has been stable, but she has gone  back to some of her old habits, and these are of concern.   Her current medications include:  1. Aspirin 325 mg daily.  2. Digoxin 0.125 mg daily.  3. Effexor 37 mg p.o. b.i.d.  4. Multivitamin daily.  5. Flecainide 50 mg 2 tablets b.i.d.  6. Crestor 10 mg nightly.  7. Cardizem CD 240 mg daily.   PHYSICAL EXAMINATION:  Her weight is 175 pounds, that is up slightly.  The blood pressure is 136/86, and the pulse is 89.  LUNG FIELDS:  Are really quite clear.  CARDIAC:  Rhythm is regular currently.  The patient had an echocardiogram.  This demonstrated normal systolic  function.  There was atrioseptal aneurysm, which she and I discussed.  There was no pericardial effusion, and the right-sided structures  appeared to be relatively normal.   The electrocardiogram demonstrates normal sinus rhythm with borderline  1st degree A-V block.  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  There is borderline prolonged QT.   IMPRESSION:  1. History of atrial fibrillation, currently treated with flecainide      and aspirin prophylaxis.  2. Significant hypercholesterolemia, on lipid lowering therapy.   PLAN:  Return to clinic in 6 months in followup.     Arturo Morton. Riley Kill, MD, [facility]  Electronically Signed    TDS/MedQ  DD: 07/29/2006  DT: 07/29/2006  Job #: [id number]

## 2010-08-22 NOTE — H&P (Signed)
NAMEYAMILETH, HAYSE NO.:  [id number]   MEDICAL RECORD NO.:  [id number]          PATIENT TYPE:  INP   LOCATION:  2103                         FACILITY:  [facility]   PHYSICIAN:  Arturo Morton. Riley Kill, M.D. [facility] OF BIRTH:  1950-06-04   DATE OF ADMISSION:  05/22/2004  DATE OF DISCHARGE:                                HISTORY & PHYSICAL   CHIEF COMPLAINT:  Dizziness/atrial fibrillation with rapid ventricular  response.   HISTORY OF PRESENT ILLNESS:  Ms. Macapagal is a 60 year old female with a no  known history of coronary artery disease. She had been having presyncopal  spells for about a month that had been increasing in frequency recently. Ms.  Rhudy saw her primary care physician. During his exam her heart rate was  noted to be elevated and irregular. An EKG revealed atrial fibrillation with  rapid ventricular response. She was sent to the emergency room and evaluated  by cardiology here for admission.   Ms. Brazie states that the dizziness episodes have been increasing in  frequency for the last two weeks. They started about a month ago. She states  that the room spins, that she has no loss of vision or hearing. She has not  fallen. She does not get weak unilaterally or bilaterally. She feels that  she can induce the symptoms by tilting her head back or to the left. She has  no history of syncope. She had vertigo about 10 or 15 years ago, but these  symptoms are not the same and at that time she was treated successfully with  Antivert. She has occasional chest pain which she describes as brief and as  a catch in her chest. There is no association with exertion, presyncope,  or palpitations. Of note, she has not had any palpitations. The chest pain  episodes she has had for several years and they have not changed recently.   PAST MEDICAL HISTORY:  Borderline hyperlipidemia, but she has not had this  checked recently. She states that her blood pressure runs on  the low side.  She has a history of depression. She had a history of vertigo 10 to 15 years  ago, treated with Antivert.   PAST SURGICAL HISTORY:  None.   ALLERGIES:  CEPHALOSPORINS.   MEDICATIONS:  Effexor-XR 75 mg daily.   SOCIAL HISTORY:  She lives alone in [place] and works at the [employer]. She does not smoke and never has. She does not use drugs. She  states she drinks beer daily and goes through about two cases a week.   FAMILY HISTORY:  Her mother is alive at age 82 and has no history of heart  disease, but has a pacemaker. Her father died in his 72s and she is not sure  what he died of, but she thinks it was heart disease. There were estranged.  She has one sister who has no history of heart disease.   REVIEW OF SYSTEMS:  She denies any fever, chills, or illnesses. She has rare  chest pain as described above. She has no history of palpitations and is not  aware that her heart beat is rapid or irregular. She has had vaginal  bleeding in the recent past. She states she had no periods for over a year  and then had four days of vaginal bleeding in January, but none since. She  states that saw her physician and is to get a D&C.  She has occasional  arthralgias. She denies any GI symptoms. She has no history of thyroid  disease.   PHYSICAL EXAMINATION:  VITAL SIGNS: Temperature 98.6, blood pressure 100/70,  pulse rate 158, O2 saturation 97% on two liters. Repeat blood pressure  116/83 manually.  GENERAL: She is a well-developed, slightly obese white female in no acute  distress.  HEENT: Her head is normocephalic and atraumatic with pupils equal, round,  and reactive to light and accommodation. Extraocular movements intact.  Sclerae clear. Nares are without discharge.  NECK: There is no lymphadenopathy, thyromegaly, bruit, or JVD noted.  CV: Her heart is rapid and irregular with an S1 and S2. No significant  murmurs, rubs, or gallops appreciated. Her distal pulses are  2+ and no  femoral bruits are appreciated.  LUNGS: Clear to auscultation bilaterally.  SKIN: No rashes or lesions are noted.  ABDOMEN: Soft and nontender with active bowel sounds and no  hepatosplenomegaly by palpation.  EXTREMITIES: There is no clubbing, cyanosis, or edema.  MUSCULOSKELETAL: There is no joint ejection fraction or effusions and no  spinal or CVA tenderness.  NEUROLOGIC: She is alert and oriented with cranial nerves II-XII grossly  intact.   EKG reveals a rate of 162, atrial fibrillation with rapid ventricular  response, and no acute ischemic changes. There is no old EKG available for  comparison. Chest x-ray and labs are pending.   ASSESSMENT/PLAN:  1.  Atrial fibrillation with rapid ventricular response. Will attempt to      decrease the heart rate with IV Cardizem. We will use IV fluids p.r.n.      to keep the systolic blood pressure greater than 95. Cardizem will be      changed to p.o. once she has stabilized. Will also use digoxin.  2.  Dizziness: She has no carotid bruits, but it is associated with head      tilt. Will check carotid Dopplers.  3.  Anticoagulation: Will add aspirin to her medication regimen and heparin      if her PT/PTT are within normal limits. Use of Coumadin will be      determined once she has been fully evaluated.  4.  Thyroid function studies have been ordered and are pending at the time      of dictation. Will also check two sets of cardiac enzymes, although she      has no specific ischemic symptoms.   Dr. Shawnie Pons saw the patient and determined the plan of care.      RB/MEDQ  D:  05/22/2004  T:  05/22/2004  Job:  [id number]

## 2010-08-22 NOTE — Assessment & Plan Note (Signed)
[facility]                              CARDIOLOGY OFFICE NOTE   MARIAEDUARDA, DEFRANCO                    MRN:          [id number]  DATE:10/20/2005                            DOB:          1950/04/08    Ms. Haughton is in for a followup visit.  To briefly summarize, she is  stable.  She denies any ongoing chest pain.  She has not had what sounds  like recurrent atrial fibrillation.  She has had a repeat echocardiogram.  Right ventricular size was upper normal.  Pulmonary artery was normal.  Pulmonary artery systolic pressure was thought to be normal.  The inferior  vena cava was only mildly elevated, but respirophasic changes were felt to  be normal as well.  She does not have overwhelming shortness of breath,  although she is not particularly active.  She remains on flecainide for  control of her atrial fibrillation.  She also has significant  hypercholesterolemia, but no coronary artery disease that is documented.   On physical examination today, the blood pressure is 125/75, the pulse is  80.  The lung fields are clear and the cardiac rhythm is regular.  There is  not a significant systolic ejection murmur nor a diastolic murmur.  It is  difficult to tell whether P2 has increased much because of her body habitus.  There is no extremity edema.  The jugular veins are not distended.   The EKG reveals normal sinus rhythm with first-degree A-V block.  There is a  rightward-oriented axis and nonspecific T wave abnormality.   IMPRESSION:  1.  History of atrial fibrillation, now controlled on aspirin prophylaxis.  2.  Significant hypercholesterolemia.  3.  Daily use of flecainide.   PLAN:  1.  Repeat lipid and liver profile.  2.  Repeat echocardiogram after the first of the year.  3.  Return to clinic in 6 months.                              Arturo Morton. Riley Kill, MD, [facility]    TDS/MedQ  DD:  10/20/2005  DT:  10/21/2005  Job #:  [id number]

## 2010-08-22 NOTE — Discharge Summary (Signed)
[name], [name] NO.:  [id number]   MEDICAL RECORD NO.:  [id number]          PATIENT TYPE:  INP   LOCATION:  3735                         FACILITY:  [facility]   PHYSICIAN:  [name], P.A. DATE OF BIRTH:  11-Dec-1950   DATE OF ADMISSION:  07/14/2004  DATE OF DISCHARGE:  07/17/2004                                 DISCHARGE SUMMARY   DISCHARGE DIAGNOSES:  1.  Admitted with typical atrial flutter, rapid ventricular rate, with rate      control and conversion after increase in Cardizem from 240 mg daily to      360 mg daily.  2.  Maintain on IV heparin throughout this hospitalization.  3.  Transesophageal echocardiogram on July 15, 2004. No left ventricular or      left atrial thrombus. No right atrial thrombus. Left atrial enlargement.  4.  On July 16, 2004, DC cardioversion. The patient converted to atrial      fibrillation, was given flecainide 300 mg times one dose and then      converted to sinus rhythm and maintained in sinus rhythm.  5.  PLAN: Home April 13th on aspirin only. No Lovenox and no Coumadin. The      patient has no thromboembolic risk factors such as a hypertension,      tobacco use, prior coronary artery disease, diabetes.  6.  Exercise Cardiolite is set for April 27th at 7:45 a.m.  7.  Initiate flecainide therapy at the April 27th visit at the discretion of      [name].  8.  Her atrial fibrillation is well controlled with flecainide and if the      patient then has breakout atrial flutter, atrial flutter ablation will      be considered.   SECONDARY DIAGNOSES:  1.  Presyncope times one month on presentation, May 22, 2004, to [facility] emergency room with finding of atrial fibrillation, rapid      ventricular rate. Started on Cardizem and digoxin.  2.  Ethanol usage.   PROCEDURE:  1.  On July 15, 2004, transesophageal echocardiogram. No intramural      thrombus noted.  2.  On July 16, 2004, DC  cardioversion. The patient converted to atrial      fibrillation with subsequent pharmacologic conversion to sinus rhythm on      flecainide 300 mg times one dose.   DISCHARGE DISPOSITION:  [name] is discharged on April 13th, hospital  day four. She is discharged in sinus rhythm. She has not had any other  cardiac issues such as pain or shortness of breath. At the time of discharge  the patient had received two doses of Coumadin, but her INR was not elevated  and was 1.0. She will not receive any further doses at this time. The  patient is in sinus rhythm with a regular rate on auscultation with a grade  II/XI murmur.   The patient is discharged on the following medications:  1.  Enteric-coated aspirin 325 mg daily.  2.  Cardizem 360 mg daily, this is a new  dose.  3.  Digoxin 0.125 mg daily.  4.  Multivitamin daily.  5.  Effexor 75 mg daily.   DISCHARGE DIET:  Low sodium, low cholesterol diet.   She follows up at the [facility] on [address] for an  exercise stress test on Thursday, July 31, 2004, at 7:45 a.m. and then she  will see [name] or his physician assistant at 11:15 on April 27th. She  is asked to eat nothing after midnight on Wednesday, April 26th. Once again,  the patient will not be discharged on any anticoagulation.   BRIEF HISTORY:  [name] is a 60 year old female admitted by [name]  on May 22, 2004, after a one month history of presyncopal spells. She  was found on admission to be in atrial fibrillation with rapid ventricular  response. She had an episode of room spinning which were increasing in  frequency. She feels she can induce her symptoms by tilting her head back  and to the left. Carotid Dopplers in February were negative for severe  internal carotid artery stenosis the left showed a 40% to 60% internal  carotid artery stenosis and the right was free of internal carotid artery  stenosis. Her symptoms are not the  same on this February presentation as her  vertigo symptoms which she had about 10 to 15 years ago and were treated  successfully with Antivert. She has an occasional feeling of a catch in  her chest. No overt pain, no awareness of palpitations, no frank syncope.  She is not short of breath and not weak. She is treated to sinus rhythm with  Cardizem and digoxin. Her TSH at that time was 1.440. Echocardiogram was not  done. Echocardiogram is to be done as an outpatient. The echocardiogram done  which was done on March 6th showed results with an EF of 50% to 55%, trace  mitral regurgitation, chamber sizes unremarkable. An exercise study was then  arranged. The patient presented on April 10th. When hooked up to the  electrocardiogram it was noted that she was in atrial flutter.  Electrocardiograms have accompanied the patient. She has no cognition that  her heart rate is irregular, no palpitation, no shortness of breath, no  chest discomfort. She has not been dizzy either. She felt bad some days  prior to this visit, a little weird in the chest, but not dizzy. Collee took  her pulse and was not racing. She knew that she had the appointment coming  up on April 10th and wanted to wait until coming in then. Once gain,  electrocardiogram on this admission shows typical atrial flutter rate in the  150s.  She presents for admission and electrophysiology consult.   HOSPITAL COURSE:  Presenting to [facility] on April 10th with  finding of atrial flutter with rapid ventricular response in the 150s. This  is a typical flutter. She was treated with her regular medications of  Cardizem 240 and digoxin 0.125 mg. She was also given IV Cardizem in  addition and converted to sinus rhythm. She was then converted to increasing  oral doses, increased from 240 to 360, and maintaining sinus rhythm. She  underwent a transesophageal echocardiogram on April 11th and the results  have been  Dictation ended  at this point.      GM/MEDQ  D:  07/17/2004  T:  07/17/2004  Job:  [id number]   cc:   Arturo Morton. Riley [name], M.D. [facility] C.  Andrey Campanile, M.D.  [address]  [place]  [place] [address]  Fax: [phone]

## 2010-08-22 NOTE — Discharge Summary (Signed)
NAMETRINE, FREAD NO.:  [id number]   MEDICAL RECORD NO.:  [id number]          PATIENT TYPE:  INP   LOCATION:  2002                         FACILITY:  [facility]   PHYSICIAN:  Charlton Haws, M.D.     DATE OF BIRTH:  September 20, 1950   DATE OF ADMISSION:  05/22/2004  DATE OF DISCHARGE:  05/25/2004                           DISCHARGE SUMMARY - REFERRING   SUMMARY OF HISTORY:  Ms. [name] is a 60 year old female who describes  presyncopal episodes for the preceding month that have been increasing in  frequency.  She saw her primary care physician and during this examination,  her heart rate was noted to be elevated near regular, thus she was referred  to the emergency room.  An EKG noted her to be in atrial fibrillation with a  rapid ventricular response.  She states that these episodes are increasing  in frequency and associate that with dizziness as well as room spinning.  She has not had any actual loss of consciousness or visual disturbances.  She also describes what sounds like vertigo about 15 years ago and feels  that these are not similar episodes.  Her history is notable for borderline  hyperlipidemia, depression and alcohol use, about two cases of beer per  week.  She was admitted for further evaluation.  Chest x-ray on admission  showed prominent heart size, no active disease.  Admission H&H was 14.0,  39.6, normal indices, platelets 218, wbc 9.3.  Subsequent hematologies were  unremarkable.  On May 25, 2004, prior to discharge, H&H was 13.3 and  37.5, normal indices, platelets 208, wbc 7.4.  On admission, PTT was 26, PT  12.2.  Sodium 141, potassium 3.6, BUN 11, creatinine 0.7, glucose 106.  Normal LFTs.  There were no subsequent chemistries.  CK-MBs x2 and one  troponin were negative for myocardial infarction.  Fasting lipids on  May 23, 2004, showed total cholesterol 241, triglycerides 253, HDL 47,  LDL 143.  TSH was 1.440.   EKG on admission showed  atrial fibrillation with a ventricular rate of 162.  Subsequent EKGs continued to show atrial fibrillation, however, rate was  slower.  At the time of discharge, the patient was in normal sinus rhythm  with PACs.   HOSPITAL COURSE:  Ms. Elsbury was admitted to unit 2000.  Dr. Riley Kill  ordered digoxin, Cardizem, an echocardiogram  and to have her thyroid  checked as well as IV heparin.  Overnight, she remained in atrial  fibrillation, however, ventricular rate was slightly better controlled.  Dr.  Riley Kill did not she was having short runs of normal sinus rhythm and did not  feel cardioversion was an option.  Her IV Cardizem was changed to p.o.  Cardizem.  Carotid Doppler were also performed and did not show any right  ICA stenosis.  She does have a left 40 to 60% ICA stenosis.  In fact,  morphology did not support velocities.  Bilateral vertebral flow antegrade.  She continued to have some intermittent atrial fibrillation on telemetry  monitor according to the nursing staff.  By May 24, 2004, Dr. Samule Ohm  reviewed, felt  that she was not at a high risk for stroke and would favor  aspirin therapy at this time if her rate remains controlled.  He felt that  she could be discharged Sunday with an outpatient echocardiogram .  In  regard to her ETOH abuse, he stated that she needs to discontinue this and  recommended Librium if she was dedicated to this option.  On May 25, 2004, this was reviewed by Dr. Eden Emms and he felt that she could be  discharged home.  At the time of discharge she was in normal sinus rhythm.   DISCHARGE DIAGNOSES:  1.  Paroxysmal atrial fibrillation with a rapid ventricular rate.  2.  Alcohol use.  3.  History as previously.   DISPOSITION:  She was given permission to continue Effexor XR 75 mg daily.  She received prescriptions for Cardizem CD 240 daily, aspirin 325 daily,  digoxin 0.125 daily, Librium 25 mg t.i.d. (after the Librium.  She only  received a  one month supply).  She was given permission to return to work on  Monday.  Maintain low salt, fat,  cholesterol diet.  She was instructed no alcohol.  She was asked to call our  office on Monday to arrange a follow-up echocardiogram  and a two week  appointment with Dr. Riley Kill.  She was also asked to follow up with Dr.  Andrey Campanile in regard to her alcohol and Librium prescription.      EW/MEDQ  D:  05/25/2004  T:  05/26/2004  Job:  [id number]   cc:   Vale Haven. Andrey Campanile, M.D.  [address]  [place]  [place] [address]  Fax: [phone]

## 2010-08-22 NOTE — Discharge Summary (Signed)
NAMELEYTON, BROWNLEE NO.:  [id number]   MEDICAL RECORD NO.:  [id number]          PATIENT TYPE:  INP   LOCATION:  3735                         FACILITY:  [facility]   PHYSICIAN:  Arturo Morton. Riley Kill, M.D. [facility] OF BIRTH:  Aug 07, 1950   DATE OF ADMISSION:  07/14/2004  DATE OF DISCHARGE:  07/17/2004                                 DISCHARGE SUMMARY   DISCHARGE DIAGNOSES:  1.  Admitted this hospitalization with typical atrial flutter with rapid      ventricular rate converting to sinus rhythm with an increase of Cardizem      from 240 mg daily which she had been taking to 360 mg.  2.  IV heparin throughout this hospitalization.  3.  Transesophageal echocardiogram  July 15, 2004 without finding of any      intramural thrombus or left atrial enlargement.  4.  Discontinued cardioversion July 16, 2004 with cardioversion to atrial      fibrillation.  Subsequent conversion to sinus rhythm after having been      given Flecainide 300 mg x1 dose.  5.  This is a PLAN:  Home July 17, 2004 on aspirin only, no Lovenox, no      Coumadin.  6.  Exercise Cardiolite study set for July 31, 2004 at 7:45 in the morning.  7.  Initiate Flecainide therapy July 31, 2004 visit after the discretion of      Dr. Shawnie Pons.  8.  If atrial fibrillation or basic rhythm is controlled on Flecainide and      if the patient then has breakthrough to atrial flutter,      electrophysiology will consider atrial flutter ablation.   SECONDARY DIAGNOSES:  1.  Presyncope x1 month upon admission May 22, 2004 to [facility] emergency room.  Finding of atrial fibrillation, rapid      ventricular rate.  Started on Cardizem and Digoxin.  2.  History of ethanol use.  3.  Dyslipidemia.  4.  Vertigo.   PROCEDURE:  1.  Transesophageal echocardiogram July 15, 2004.  No intramural thrombus.  2.  July 16, 2004 discontinue cardioversion.  Converting to atrial      fibrillation.  Subsequent  pharmacologic conversion to sinus rhythm on      Flecainide.  Remains in sinus rhythm 16 hours postconversion.   DISCHARGE DISPOSITION:  The patient discharging July 17, 2004.  She is in  sinus rhythm achieving 93% oxygen saturation on room air.  She has had two  doses of Coumadin but her INR is only 1.0 at the time of discharge.  She  will not be taking Coumadin at home and her IV heparin will be discontinued.  She is achieving 93% oxygen saturation on room air.  She has had no chest  pain, no dyspnea, actually no dizziness either, she is very relaxed.  She  will have the follow up as dictated.  Exercise stress test on July 31, 2004  with possible initiation of Flecainide therapy at that time and watchful  waiting for possible breakthrough of atrial flutter.  The patient discharged  on the following medications.   DISCHARGE MEDICATIONS:  1.  A new dose of Cardizem 360 mg daily.  2.  Enteric-coated aspirin 325 mg daily.  3.  Digoxin 0.125 mg daily.  4.  Multivitamin daily.  5.  Effexor 75 mg daily.   DISPOSITION:  Once again follow up with [facility] on July 30, 2004, [place].  Exercise stress test Thursday, July 31, 2004 at  7:45 in the morning.  She is asked to eat nothing after midnight Wednesday,  July 30, 2004.  To see Dr. Riley Kill or his P.A. at 11:15 in the morning on  July 31, 2004.   HISTORY OF PRESENT ILLNESS:  Ms. Charisa Twitty is a 60 year old female  admitted by Dr. Riley Kill, May 22, 2004 with a one month history of  presyncopal spells.  At that time she was found to have atrial fibrillation  rapid ventricular rate.  Her symptoms were room spinning which was  increasing in frequency.  She feels can induce the symptoms by tilting her  head back and to the left.  Carotid Dopplers were done at that time showing  no significant internal carotid artery stenosis.  Her symptoms are not the  same as her vertigo.  She had this 10-15 years ago and  it was successfully  treated with Antivert.  She has the occasional feeling of a catch in her  chest, no overt pain.  She  has no awareness of palpitations, no frank  syncope, she is not short of breath, not weak.  She was treated in sinus  rhythm with Cardizem and Digoxin in February.  Her TSH at that time was  1.440.  Echocardiogram was not done, it was scheduled for June 19, 2004 and  it showed ejection fraction of 50-55%.  She had trace mitral regurgitation.  The patient was then scheduled for an exercise Cardiolite study on July 14, 2004.  She presented the day of this admission.  She presented to the office  of [facility], hooked up to electrocardiogram and was noted to be in  atrial flutter.  Her electrocardiograms are accompanying the patient.  She  has no cognition of irregular rate, no feeling of palpitations, shortness of  breath or chest discomfort.  She has not been dizzy at all either.  She did  feel bad last Thursday, a little weird in the chest although not dizzy.  She asked a colleague to take her pulse.  It was not racing.  She new she  had the appointment coming up at July 14, 2004 and waited to come in until  then.  Electrocardiogram one again July 14, 2004 shows atrial flutter  typical pattern rate in the 150s.   HOSPITAL COURSE:  The patient presenting from the office of [facility] on July 14, 2004 in atrial flutter with rapid rate.  She converted  with increase in Cardizem on a IV Cardizem drip.  Her Cardizem was increased  from 240 to 360 and she has maintained sinus rhythm throughout the remainder  of her hospitalization.  It is not felt that she need anticoagulation  therapy.  She has been maintained on IV heparin throughout this  hospitalization.  It was planned at one time that she would go home on  Lovenox bridging to Coumadin, this was not felt to be necessary.  The patient goes home with the medications and follow up as  dictated.      GM/MEDQ  D:  07/17/2004  T:  07/17/2004  Job:  [id number]   cc:   Duke Salvia, M.D.   [place] C. Andrey Campanile, M.D.  [address]  [place]  [place] [address]  Fax: [phone]

## 2010-08-22 NOTE — H&P (Signed)
NAMETALA, EBER NO.:  [id number]   MEDICAL RECORD NO.:  [id number]          PATIENT TYPE:  INP   LOCATION:                               FACILITY:  [facility]   PHYSICIAN:  Arturo Morton. Riley Kill, M.D. [facility] OF BIRTH:  [birth year]   DATE OF ADMISSION:  07/14/2004  DATE OF DISCHARGE:                                HISTORY & PHYSICAL   CHIEF COMPLAINT:  I feel okay.   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 She had  been having some presyncopal spells for about a month and was subsequently  admitted to the hospital in mid February with what appeared to be atrial  fibrillation with rapid ventricular response. She was seen in the emergency  room and subsequently admitted. With admission to the hospital,   Dictation ended at this point.       ___________________________________________  Arturo Morton. Riley Kill, M.D. [facility]    TDS/MEDQ  D:  07/14/2004  T:  07/14/2004  Job:  [id number]

## 2010-08-22 NOTE — H&P (Signed)
NAMEPASHA, Traci NO.:  [id number]   MEDICAL RECORD NO.:  [id number]          PATIENT TYPE:  INP   LOCATION:                               FACILITY:  [facility]   PHYSICIAN:  Arturo Morton. Riley Kill, M.D. [facility] OF BIRTH:  08-13-1950   DATE OF ADMISSION:  07/14/2004  DATE OF DISCHARGE:                                HISTORY & PHYSICAL   CHIEF COMPLAINT:  I feel okay.   HISTORY OF PRESENT ILLNESS:  Ms. Arciniega is a delightful 60 year old who  had been having some presyncopal spells and was admitted in early February  with atrial fibrillation with rapid ventricular response.  She eventually  converted and was subsequently discharged with a daily aspirin.  She was  noted to have a left 40-60% ICA stenosis.  The atrial fibrillation converted  and she has generally been doing pretty well.  She subsequently had an  outpatient echocardiogram.  This echocardiogram demonstrated low normal EF  but otherwise unremarkable study.  There did not appear to be significant  valvular disease.  There was mild mitral annular calcification.  In  addition, laboratory studies revealed normal hemoglobin, hematocrit, and  platelet count.  Her glucose was borderline.  CPKs were unremarkable.  Cholesterols were elevated with a cholesterol of 241 and an LDL of 143.  TSH  was normal at that time.  It was 1.4.  She has been feeling relatively well  but noticed the other day that her rhythm might be fast.  She is on Cardizem  and Lanoxin to try to keep her rate under control.   PAST MEDICAL HISTORY:  1.  Borderline hyperlipidemia.  2.  History of depression.  3.  History of vertigo 10-15 years ago.   PAST SURGICAL HISTORY:  None.   ALLERGIES:  CEPHALOSPORINS.   MEDICATIONS:  Effexor XR 75 mg daily.   SOCIAL HISTORY:  She lives alone and works at [employer].  She does not  smoke and does not use drugs.  She does drink some beer and has had in the  past.   FAMILY HISTORY:  Her mother is  alive at age 47 and has no history of heart  disease but does have a pacemaker.  Her father died in his 36s, but she  thinks it may have been heart disease.  She has a sister with no history of  heart disease.   REVIEW OF SYSTEMS:  She denies fever, chills, or other major problems.   PHYSICAL EXAMINATION:  GENERAL:  On physical examination today, she is an  alert, oriented female.  There is no acute distress.  HEENT:  Examination is unremarkable.  LUNGS:  The lung fields are clear to auscultation and percussion.  CARDIAC:  Rhythm reveals no murmur.  The cardiac rhythm is irregularly  irregular.  ABDOMEN:  Soft.  EXTREMITIES:  No significant edema.   DIAGNOSTIC STUDIES:  Electrocardiogram demonstrates atrial fibrillation with  rapid ventricular response.  There is a right-ward oriented axis.  The EKG  is compatible with typical flutter waves.   IMPRESSION:  1.  Recurrent atrial arrhythmias this time characterized  by atrial flutter      with rapid ventricular response despite medications.  2.  Hyperlipidemia.  3.  History of carotid stenosis.   PLAN:  1.  Admit to hospital.  2.  Rate control.  3.  Intravenous heparin.  4.  Consultation with electrophysiology service.       ___________________________________________  Arturo Morton Riley Kill, M.D. [facility]    TDS/MEDQ  D:  07/14/2004  T:  07/14/2004  Job:  [id number]

## 2010-11-05 ENCOUNTER — Other Ambulatory Visit: Payer: Self-pay | Admitting: *Deleted

## 2010-11-06 ENCOUNTER — Other Ambulatory Visit: Payer: Self-pay | Admitting: *Deleted

## 2010-11-06 MED ORDER — FLECAINIDE ACETATE 50 MG PO TABS: ORAL_TABLET | ORAL | Status: DC

## 2010-11-27 ENCOUNTER — Other Ambulatory Visit: Payer: Self-pay | Admitting: Cardiology

## 2010-12-09 ENCOUNTER — Encounter: Payer: BC Managed Care – PPO | Admitting: Physician Assistant

## 2010-12-12 ENCOUNTER — Encounter: Payer: Self-pay | Admitting: Physician Assistant

## 2010-12-15 ENCOUNTER — Ambulatory Visit (INDEPENDENT_AMBULATORY_CARE_PROVIDER_SITE_OTHER): Payer: BC Managed Care – PPO | Admitting: Physician Assistant

## 2010-12-15 DIAGNOSIS — I4891 Unspecified atrial fibrillation: Secondary | ICD-10-CM

## 2010-12-15 NOTE — Progress Notes (Signed)
Exercise Treadmill Test  Pre-Exercise Testing Evaluation Rhythm: normal sinus  Rate: 92   PR:  .20 QRS:  .09  QT:  .36 QTc: .45     Test  Exercise Tolerance Test Ordering MD: Shawnie Pons, MD  Interpreting MD:  Tereso Newcomer, PA-C  Unique Test No: 1  Treadmill:  1  Indication for ETT: Flecainide  Contraindication to ETT: No   Stress Modality: exercise - treadmill  Cardiac Imaging Performed: non   Protocol: standard Bruce - maximal  Max BP: 168/74  Max MPHR (bpm):  161 85% MPR (bpm):  137  MPHR obtained (bpm): 137 % MPHR obtained: 85  Reached 85% MPHR (min:sec):  5:00 Total Exercise Time (min-sec):  5:27  Workload in METS:  8.2 Borg Scale: 17  Reason ETT Terminated:  patient's desire to stop    ST Segment Analysis At Rest: normal ST segments - no evidence of significant ST depression With Exercise: non-specific ST changes  Other Information Arrhythmia:  No Angina during ETT:  absent (0) Quality of ETT:  diagnostic  ETT Interpretation:  normal - no evidence of ischemia by ST analysis  Comments: Fair exercise tolerance. No chest pain. Normal BP response to exercise. Increased artifact.  Target HR was achieved. No obvious ST-T changes to suggest ischemia.   Recommendations: Follow up with Dr. Riley Kill as directed.

## 2011-02-17 ENCOUNTER — Telehealth: Payer: Self-pay | Admitting: Cardiology

## 2011-02-17 DIAGNOSIS — E7800 Pure hypercholesterolemia, unspecified: Secondary | ICD-10-CM

## 2011-02-17 NOTE — Telephone Encounter (Signed)
This pt is due for a lipid and liver profile. Lab scheduled on 02/18/11.

## 2011-02-17 NOTE — Telephone Encounter (Signed)
Pt wants to know if she needs blood work. She has appt on [phone] please let her know

## 2011-02-18 ENCOUNTER — Ambulatory Visit (INDEPENDENT_AMBULATORY_CARE_PROVIDER_SITE_OTHER): Payer: BC Managed Care – PPO | Admitting: *Deleted

## 2011-02-18 DIAGNOSIS — E7800 Pure hypercholesterolemia, unspecified: Secondary | ICD-10-CM

## 2011-02-18 LAB — LIPID PANEL
Cholesterol: 179 mg/dL (ref 0–200)
LDL Cholesterol: 84 mg/dL (ref 0–99)
Triglycerides: 165 mg/dL — ABNORMAL HIGH (ref 0.0–149.0)

## 2011-02-18 LAB — HEPATIC FUNCTION PANEL
Bilirubin, Direct: 0.1 mg/dL (ref 0.0–0.3)
Total Protein: 7.3 g/dL (ref 6.0–8.3)

## 2011-02-24 ENCOUNTER — Encounter: Payer: Self-pay | Admitting: Cardiology

## 2011-02-24 ENCOUNTER — Ambulatory Visit (INDEPENDENT_AMBULATORY_CARE_PROVIDER_SITE_OTHER): Payer: BC Managed Care – PPO | Admitting: Cardiology

## 2011-02-24 DIAGNOSIS — Z8679 Personal history of other diseases of the circulatory system: Secondary | ICD-10-CM

## 2011-02-24 DIAGNOSIS — E7800 Pure hypercholesterolemia, unspecified: Secondary | ICD-10-CM

## 2011-02-24 DIAGNOSIS — I4891 Unspecified atrial fibrillation: Secondary | ICD-10-CM

## 2011-02-24 NOTE — Patient Instructions (Signed)
Your physician wants you to follow-up in: 6 months. You will receive a reminder letter in the mail two months in advance. If you don't receive a letter, please call our office to schedule the follow-up appointment.  Your physician has requested that you have an echocardiogram. Echocardiography is a painless test that uses sound waves to create images of your heart. It provides your doctor with information about the size and shape of your heart and how well your heart's chambers and valves are working. This procedure takes approximately one hour. There are no restrictions for this procedure. To be done in 6 months on day of appt with Dr. Riley Kill

## 2011-03-02 ENCOUNTER — Other Ambulatory Visit: Payer: Self-pay | Admitting: Cardiology

## 2011-03-10 ENCOUNTER — Other Ambulatory Visit: Payer: Self-pay | Admitting: Cardiology

## 2011-03-31 NOTE — Progress Notes (Signed)
   HPI:  [name] is about the same, nothing new since I last saw her.  We have continued to follow her over time.  No new palpitations, or feelings of atrial fib.  Habits have not changed all that much, although she acknowledges readily some of the issues.  She does get active counseling.  No chest pain.  We discussed some of the new guidelines regarding anticoagulation.  Will not make changes at present. Seems to tolerate meds.   Current Outpatient Prescriptions  Medication Sig Dispense Refill  . aspirin 325 MG tablet Take 325 mg by mouth daily.        . CRESTOR 20 MG tablet TAKE (1/2) TABLET DAILY.  30 each  6  . digoxin (LANOXIN) 0.125 MG tablet TAKE 1 TABLET ONCE DAILY.  30 tablet  6  . sertraline (ZOLOFT) 100 MG tablet Take 100 mg by mouth daily.       Marland Kitchen CARDIZEM CD 240 MG 24 hr capsule TAKE (1) CAPSULE DAILY.  30 each  6  . TAMBOCOR 50 MG tablet TAKE (2) TABLETS TWICE DAILY.  120 each  12    Allergies  Allergen Reactions  . Cephalosporins   . Sulfonamide Derivatives     REACTION: rash    Past Medical History  Diagnosis Date  . Hyperlipidemia   . Depression   . History of vertigo   . ETOH abuse   . 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 Arrhythmia     PAF ON FLECAINIDE  . Carotid artery occlusion     Past Surgical History  Procedure Date  . None     Family History  Problem Relation Age of Onset  . Heart disease Father     History   Social History  . Marital Status: Single    Spouse Name: N/A    Number of Children: N/A  . Years of Education: N/A   Occupational History  . Not on file.   Social History Main Topics  . Smoking status: Never Smoker   . Smokeless tobacco: Not on file  . Alcohol Use: Yes  . Drug Use: No  . Sexually Active: Not on file   Other Topics Concern  . Not on file   Social History Narrative  . No narrative on file    ROS: Please see the HPI.  All other systems reviewed and negative.  PHYSICAL EXAM:  BP 118/58   Pulse 71  Resp 18  Ht 5\' 3"  (1.6 m)  Wt 73.483 kg (162 lb)  BMI 28.70 kg/m2  General: Well developed, well nourished, in no acute distress. Head:  Normocephalic and atraumatic. Neck: no JVD Lungs: Clear to auscultation and percussion. Heart: Normal S1 and S2.  No murmur, rubs or gallops.  Abdomen:  Normal bowel sounds; soft; non tender; no organomegaly Pulses: Pulses normal in all 4 extremities. Extremities: No clubbing or cyanosis. No edema. Neurologic: Alert and oriented x 3.  EKG:  NSR.  Vertical axis.  Non specific T wave changes.  Lower voltage QRS.  No definite change from 3-12 except mild T flattening.    ASSESSMENT AND PLAN:

## 2011-03-31 NOTE — Assessment & Plan Note (Signed)
Well controlled on current regimen.  Tolerates meds well.  No specific issue.  Note no prior major echo abnormality except atrial aneurysm---on ASA.  Continue current meds.  Repeat echo in early spring.

## 2011-03-31 NOTE — Progress Notes (Signed)
Patient ID: [name], female   DOB: 12/08/1950, 60 y.o.   MRN: [id number]

## 2011-03-31 NOTE — Assessment & Plan Note (Signed)
Last lipid and liver pretty encouraging.  Tolerates meds.  LFTs ok.  Triglycerides may reflect a component of chronic alcohol use.

## 2011-06-23 ENCOUNTER — Other Ambulatory Visit: Payer: Self-pay | Admitting: Cardiology

## 2011-08-19 ENCOUNTER — Other Ambulatory Visit: Payer: Self-pay | Admitting: Cardiology

## 2011-09-01 ENCOUNTER — Ambulatory Visit (HOSPITAL_COMMUNITY): Payer: BC Managed Care – PPO | Attending: Cardiology

## 2011-09-01 ENCOUNTER — Encounter: Payer: Self-pay | Admitting: Cardiology

## 2011-09-01 ENCOUNTER — Ambulatory Visit (INDEPENDENT_AMBULATORY_CARE_PROVIDER_SITE_OTHER): Payer: BC Managed Care – PPO | Admitting: Cardiology

## 2011-09-01 ENCOUNTER — Other Ambulatory Visit: Payer: Self-pay

## 2011-09-01 VITALS — BP 112/80 | HR 70 | Ht 63.0 in | Wt 163.1 lb

## 2011-09-01 DIAGNOSIS — E7800 Pure hypercholesterolemia, unspecified: Secondary | ICD-10-CM

## 2011-09-01 DIAGNOSIS — I519 Heart disease, unspecified: Secondary | ICD-10-CM | POA: Insufficient documentation

## 2011-09-01 DIAGNOSIS — I4891 Unspecified atrial fibrillation: Secondary | ICD-10-CM

## 2011-09-01 DIAGNOSIS — I517 Cardiomegaly: Secondary | ICD-10-CM | POA: Insufficient documentation

## 2011-09-01 DIAGNOSIS — I6529 Occlusion and stenosis of unspecified carotid artery: Secondary | ICD-10-CM | POA: Insufficient documentation

## 2011-09-01 DIAGNOSIS — I510 Cardiac septal defect, acquired: Secondary | ICD-10-CM | POA: Insufficient documentation

## 2011-09-01 DIAGNOSIS — I059 Rheumatic mitral valve disease, unspecified: Secondary | ICD-10-CM | POA: Insufficient documentation

## 2011-09-01 NOTE — Assessment & Plan Note (Signed)
No clinical recurrence.  QTc is normal.  Remains on AA therapy.

## 2011-09-01 NOTE — Assessment & Plan Note (Signed)
Will recheck these values again in November.

## 2011-09-01 NOTE — Patient Instructions (Signed)
Your physician wants you to follow-up in: 6 MONTHS with Dr Riley Kill. You will receive a reminder letter in the mail two months in advance. If you don't receive a letter, please call our office to schedule the follow-up appointment.  Your physician recommends that you return for a FASTING LIPID and LIVER Profile in 6 MONTHS.  Your physician recommends that you continue on your current medications as directed. Please refer to the Current Medication list given to you today.

## 2011-09-01 NOTE — Progress Notes (Signed)
   HPI:  Patient is doing pretty well she is dealing with some family issues at the present time. She does not sense that she has been out of rhythm. She denies any nausea or other symptoms associated with drug toxicity. She underwent echocardiography today, and we reviewed those results today in the clinic.  Current Outpatient Prescriptions  Medication Sig Dispense Refill  . aspirin 325 MG tablet Take 325 mg by mouth daily.        Marland Kitchen CARDIZEM CD 240 MG 24 hr capsule TAKE (1) CAPSULE DAILY.  30 each  6  . CRESTOR 20 MG tablet TAKE (1/2) TABLET DAILY.  30 each  5  . digoxin (LANOXIN) 0.125 MG tablet TAKE 1 TABLET ONCE DAILY.  30 tablet  5  . sertraline (ZOLOFT) 100 MG tablet Take one and one-half tablet daily      . TAMBOCOR 50 MG tablet TAKE (2) TABLETS TWICE DAILY.  120 each  12    Allergies  Allergen Reactions  . Cephalosporins   . Sulfonamide Derivatives     REACTION: rash    Past Medical History  Diagnosis Date  . Hyperlipidemia   . Depression   . History of vertigo   . ETOH abuse   . 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 Arrhythmia     PAF ON FLECAINIDE  . Carotid artery occlusion     Past Surgical History  Procedure Date  . None     Family History  Problem Relation Age of Onset  . Heart disease Father     History   Social History  . Marital Status: Single    Spouse Name: N/A    Number of Children: N/A  . Years of Education: N/A   Occupational History  . Not on file.   Social History Main Topics  . Smoking status: Never Smoker   . Smokeless tobacco: Not on file  . Alcohol Use: Yes  . Drug Use: No  . Sexually Active: Not on file   Other Topics Concern  . Not on file   Social History Narrative  . No narrative on file    ROS: Please see the HPI.  All other systems reviewed and negative.  PHYSICAL EXAM:  BP 112/80  Pulse 70  Ht 5\' 3"  (1.6 m)  Wt 163 lb 1.9 oz (73.991 kg)  BMI 28.90 kg/m2  General: Well developed, well nourished, in no  acute distress. Head:  Normocephalic and atraumatic. Neck: no JVD Lungs: Clear to auscultation and percussion. Heart: Normal S1 and S2.  No murmur, rubs or gallops.  Pulses: Pulses normal in all 4 extremities. Extremities: No clubbing or cyanosis. No edema. Neurologic: Alert and oriented x 3.  EKG:  NSR with borderline first degree av block.  Rightward axis.  Delay in R wave progression.  ECHO   Study Conclusions  - Left ventricle: The cavity size was normal. Wall thickness was increased in a pattern of mild LVH. Systolic function was normal. The estimated ejection fraction was in the range of 60% to 65%. Wall motion was normal; there were no regional wall motion abnormalities. Doppler parameters are consistent with abnormal left ventricular relaxation (grade 1 diastolic dysfunction). - Mitral valve: Mild regurgitation. - Left atrium: The atrium was mildly dilated. - Atrial septum: There was redundancy of the septum, with borderline criteria for aneurysm.     ASSESSMENT AND PLAN:

## 2011-10-09 ENCOUNTER — Other Ambulatory Visit: Payer: Self-pay | Admitting: Cardiology

## 2011-10-09 NOTE — Telephone Encounter (Signed)
Fax Received. Refill Completed. [name] (R.M.A)   

## 2011-12-20 ENCOUNTER — Other Ambulatory Visit: Payer: Self-pay | Admitting: Cardiology

## 2012-01-27 ENCOUNTER — Encounter: Payer: Self-pay | Admitting: Cardiology

## 2012-01-27 NOTE — Telephone Encounter (Signed)
This encounter was created in error - please disregard.

## 2012-01-27 NOTE — Telephone Encounter (Signed)
Pt is filling out insurance info and need lab levels and last ov to complete forms

## 2012-01-27 NOTE — Telephone Encounter (Signed)
Left message for pt to call back  °

## 2012-02-08 ENCOUNTER — Other Ambulatory Visit (INDEPENDENT_AMBULATORY_CARE_PROVIDER_SITE_OTHER): Payer: BC Managed Care – PPO

## 2012-02-08 DIAGNOSIS — I4891 Unspecified atrial fibrillation: Secondary | ICD-10-CM

## 2012-02-08 DIAGNOSIS — E7800 Pure hypercholesterolemia, unspecified: Secondary | ICD-10-CM

## 2012-02-08 LAB — LIPID PANEL
HDL: 59 mg/dL (ref >39.00)
LDL Cholesterol: 102 mg/dL — ABNORMAL HIGH (ref 0–99)
Total CHOL/HDL Ratio: 3
VLDL: 27.6 mg/dL (ref 0.0–40.0)

## 2012-02-08 LAB — HEPATIC FUNCTION PANEL: Total Bilirubin: 0.6 mg/dL (ref 0.3–1.2)

## 2012-02-15 ENCOUNTER — Ambulatory Visit (INDEPENDENT_AMBULATORY_CARE_PROVIDER_SITE_OTHER): Payer: BC Managed Care – PPO | Admitting: Cardiology

## 2012-02-15 ENCOUNTER — Encounter: Payer: Self-pay | Admitting: Cardiology

## 2012-02-15 VITALS — BP 108/64 | HR 91 | Ht 63.0 in | Wt 168.0 lb

## 2012-02-15 DIAGNOSIS — F1010 Alcohol abuse, uncomplicated: Secondary | ICD-10-CM

## 2012-02-15 DIAGNOSIS — E7800 Pure hypercholesterolemia, unspecified: Secondary | ICD-10-CM

## 2012-02-15 DIAGNOSIS — I4891 Unspecified atrial fibrillation: Secondary | ICD-10-CM

## 2012-02-15 NOTE — Assessment & Plan Note (Signed)
Seems to be stable without much recurrence.  Remains on a single ASA.

## 2012-02-15 NOTE — Assessment & Plan Note (Signed)
Long term issue that she readily acknowledges, which is good.  She has appropriate counseling and is working with that individual which I applauded.

## 2012-02-15 NOTE — Patient Instructions (Addendum)
Your physician recommends that you schedule a follow-up appointment in: MARCH 2014  Your physician recommends that you continue on your current medications as directed. Please refer to the Current Medication list given to you today.  

## 2012-02-15 NOTE — Progress Notes (Signed)
   HPI:  Patient is in for followup. In general she is doing extremely well. She's not having any major problems currently. We reviewed some her laboratory studies today.  Current Outpatient Prescriptions  Medication Sig Dispense Refill  . aspirin 325 MG tablet Take 325 mg by mouth daily.        Marland Kitchen CARDIZEM CD 240 MG 24 hr capsule TAKE (1) CAPSULE DAILY.  30 each  5  . CRESTOR 20 MG tablet TAKE (1/2) TABLET DAILY.  30 each  5  . digoxin (LANOXIN) 0.125 MG tablet TAKE 1 TABLET ONCE DAILY.  30 tablet  9  . sertraline (ZOLOFT) 100 MG tablet Take one and one-half tablet daily      . TAMBOCOR 50 MG tablet TAKE (2) TABLETS TWICE DAILY.  120 each  12    Allergies  Allergen Reactions  . Cephalosporins   . Sulfonamide Derivatives     REACTION: rash    Past Medical History  Diagnosis Date  . Hyperlipidemia   . Depression   . History of vertigo   . ETOH abuse   . 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 Arrhythmia     PAF ON FLECAINIDE  . Carotid artery occlusion     Past Surgical History  Procedure Date  . None     Family History  Problem Relation Age of Onset  . Heart disease Father     History   Social History  . Marital Status: Single    Spouse Name: N/A    Number of Children: N/A  . Years of Education: N/A   Occupational History  . Not on file.   Social History Main Topics  . Smoking status: Never Smoker   . Smokeless tobacco: Not on file  . Alcohol Use: Yes  . Drug Use: No  . Sexually Active: Not on file   Other Topics Concern  . Not on file   Social History Narrative  . No narrative on file    ROS: Please see the HPI.  All other systems reviewed and negative.  PHYSICAL EXAM:  BP 108/64  Pulse 91  Ht 5\' 3"  (1.6 m)  Wt 168 lb (76.204 kg)  BMI 29.76 kg/m2  General: Well developed, well nourished, in no acute distress. Head:  Normocephalic and atraumatic. Neck: no JVD Lungs: Clear to auscultation and percussion. Heart: Normal S1 and S2.  No  murmur, rubs or gallops.  Abdomen:  Normal bowel sounds; soft; non tender; no organomegaly Pulses: Pulses normal in all 4 extremities. Extremities: No clubbing or cyanosis. No edema. Neurologic: Alert and oriented x 3.  EKG:  NSR.  Vertical axis.   ASSESSMENT AND PLAN:

## 2012-02-15 NOTE — Assessment & Plan Note (Signed)
Borderline control.  However, tolerating meds.

## 2012-03-28 ENCOUNTER — Other Ambulatory Visit: Payer: Self-pay | Admitting: *Deleted

## 2012-03-28 MED ORDER — FLECAINIDE ACETATE 50 MG PO TABS: ORAL_TABLET | ORAL | Status: DC

## 2012-04-11 ENCOUNTER — Other Ambulatory Visit: Payer: Self-pay | Admitting: *Deleted

## 2012-04-11 MED ORDER — DILTIAZEM HCL ER COATED BEADS 240 MG PO CP24: 240.0000 mg | ORAL_CAPSULE | Freq: Every day | ORAL | Status: DC

## 2012-04-12 ENCOUNTER — Other Ambulatory Visit: Payer: Self-pay | Admitting: *Deleted

## 2012-06-16 ENCOUNTER — Encounter: Payer: Self-pay | Admitting: Cardiology

## 2012-06-16 ENCOUNTER — Ambulatory Visit (INDEPENDENT_AMBULATORY_CARE_PROVIDER_SITE_OTHER): Payer: BC Managed Care – PPO | Admitting: Cardiology

## 2012-06-16 VITALS — BP 120/80 | HR 70 | Ht 63.0 in | Wt 165.4 lb

## 2012-06-16 DIAGNOSIS — E7800 Pure hypercholesterolemia, unspecified: Secondary | ICD-10-CM

## 2012-06-16 DIAGNOSIS — Z8679 Personal history of other diseases of the circulatory system: Secondary | ICD-10-CM

## 2012-06-16 DIAGNOSIS — I4891 Unspecified atrial fibrillation: Secondary | ICD-10-CM

## 2012-06-16 MED ORDER — DIGOXIN 125 MCG PO TABS: 0.0625 mg | ORAL_TABLET | Freq: Every day | ORAL | Status: DC

## 2012-06-16 NOTE — Patient Instructions (Signed)
Your physician recommends that you have lab work today: Flecainide level  Your physician has recommended you make the following change in your medication: DECREASE Digoxin (Lanoxin) to one-half tablet by mouth daily  Your physician wants you to follow-up in: 6 MONTHS with Dr Dietrich Pates.  You will receive a reminder letter in the mail two months in advance. If you don't receive a letter, please call our office to schedule the follow-up appointment.

## 2012-06-16 NOTE — Progress Notes (Signed)
HPI:  Patient in for follow up.  She is doing well.  No major complaints.  She does not think she is out of rhythm.    Current Outpatient Prescriptions  Medication Sig Dispense Refill  . aspirin 325 MG tablet Take 325 mg by mouth daily.        . CRESTOR 20 MG tablet TAKE (1/2) TABLET DAILY.  30 each  5  . digoxin (LANOXIN) 0.125 MG tablet TAKE 1 TABLET ONCE DAILY.  30 tablet  9  . diltiazem (CARDIZEM CD) 240 MG 24 hr capsule Take 1 capsule (240 mg total) by mouth daily.  30 capsule  6  . flecainide (TAMBOCOR) 50 MG tablet Take two tablets twice daily  120 tablet  12  . sertraline (ZOLOFT) 100 MG tablet 2 (two) times daily. Take one and one-half tablet daily       No current facility-administered medications for this visit.    Allergies  Allergen Reactions  . Cephalosporins   . Sulfonamide Derivatives     REACTION: rash    Past Medical History  Diagnosis Date  . Hyperlipidemia   . Depression   . History of vertigo   . ETOH abuse   . 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 Arrhythmia     PAF ON FLECAINIDE  . Carotid artery occlusion     Past Surgical History  Procedure Laterality Date  . None      Family History  Problem Relation Age of Onset  . Heart disease Father     History   Social History  . Marital Status: Single    Spouse Name: N/A    Number of Children: N/A  . Years of Education: N/A   Occupational History  . Not on file.   Social History Main Topics  . Smoking status: Never Smoker   . Smokeless tobacco: Not on file  . Alcohol Use: Yes  . Drug Use: No  . Sexually Active: Not on file   Other Topics Concern  . Not on file   Social History Narrative  . No narrative on file    ROS: Please see the HPI.  All other systems reviewed and negative.  PHYSICAL EXAM:  BP 120/80  Pulse 70  Ht 5\' 3"  (1.6 m)  Wt 165 lb 6.4 oz (75.025 kg)  BMI 29.31 kg/m2  SpO2 96%  General: Well developed, well nourished, in no acute distress. Head:   Normocephalic and atraumatic. Neck: no JVD Lungs: Clear to auscultation and percussion. Heart: Normal S1 and S2.  No murmur, rubs or gallops.  Abdomen:  Normal bowel sounds; soft; non tender; no organomegaly Pulses: Pulses normal in all 4 extremities. Extremities: No clubbing or cyanosis. No edema. Neurologic: Alert and oriented x 3.  EKG:  NSR with first degree av block.  QTc normal.  PR  226.  Low voltage overall.    ECHO 2013  Study Conclusions  - Left ventricle: The cavity size was normal. Wall thickness was increased in a pattern of mild LVH. Systolic function was normal. The estimated ejection fraction was in the range of 60% to 65%. Wall motion was normal; there were no regional wall motion abnormalities. Doppler parameters are consistent with abnormal left ventricular relaxation (grade 1 diastolic dysfunction). - Mitral valve: Mild regurgitation. - Left atrium: The atrium was mildly dilated. - Atrial septum: There was redundancy of the septum, with borderline criteria for aneurysm.    ASSESSMENT AND PLAN:  1.  Follow up Dr. Tenny Craw 6 months 2.  Decrease dig and poss dc  3.  Check Flecainide level.

## 2012-06-18 NOTE — Assessment & Plan Note (Signed)
She is stable on her current dose. Her LDL sodium slightly better in the past. We will reemphasize diet.

## 2012-06-18 NOTE — Assessment & Plan Note (Signed)
The patient is currently maintained on a regimen that includes digoxin and flecainide. She's not had significant clinical recurrence in some time. She does have PR prolongation, and we will taper her Lanoxin with a goal towards eliminating it.

## 2012-06-22 LAB — FLECAINIDE LEVEL: Flecainide: 0.82 ug/mL (ref 0.20–1.00)

## 2012-08-28 ENCOUNTER — Emergency Department (HOSPITAL_COMMUNITY)
Admission: EM | Admit: 2012-08-28 | Discharge: 2012-08-28 | Disposition: A | Discharge status: Home / Self Care | Payer: BC Managed Care – PPO | Attending: Emergency Medicine | Admitting: Emergency Medicine

## 2012-08-28 ENCOUNTER — Emergency Department (HOSPITAL_COMMUNITY): Payer: BC Managed Care – PPO

## 2012-08-28 DIAGNOSIS — Z23 Encounter for immunization: Secondary | ICD-10-CM | POA: Insufficient documentation

## 2012-08-28 DIAGNOSIS — Z7982 Long term (current) use of aspirin: Secondary | ICD-10-CM | POA: Insufficient documentation

## 2012-08-28 DIAGNOSIS — Y92009 Unspecified place in unspecified non-institutional (private) residence as the place of occurrence of the external cause: Secondary | ICD-10-CM | POA: Insufficient documentation

## 2012-08-28 DIAGNOSIS — R609 Edema, unspecified: Secondary | ICD-10-CM | POA: Insufficient documentation

## 2012-08-28 DIAGNOSIS — S62607B Fracture of unspecified phalanx of left little finger, initial encounter for open fracture: Secondary | ICD-10-CM

## 2012-08-28 DIAGNOSIS — Z8669 Personal history of other diseases of the nervous system and sense organs: Secondary | ICD-10-CM | POA: Insufficient documentation

## 2012-08-28 DIAGNOSIS — E785 Hyperlipidemia, unspecified: Secondary | ICD-10-CM | POA: Insufficient documentation

## 2012-08-28 DIAGNOSIS — Z79899 Other long term (current) drug therapy: Secondary | ICD-10-CM | POA: Insufficient documentation

## 2012-08-28 DIAGNOSIS — Y9389 Activity, other specified: Secondary | ICD-10-CM | POA: Insufficient documentation

## 2012-08-28 DIAGNOSIS — W108XXA Fall (on) (from) other stairs and steps, initial encounter: Secondary | ICD-10-CM | POA: Insufficient documentation

## 2012-08-28 DIAGNOSIS — S62609B Fracture of unspecified phalanx of unspecified finger, initial encounter for open fracture: Secondary | ICD-10-CM | POA: Insufficient documentation

## 2012-08-28 DIAGNOSIS — F3289 Other specified depressive episodes: Secondary | ICD-10-CM | POA: Insufficient documentation

## 2012-08-28 DIAGNOSIS — Z8679 Personal history of other diseases of the circulatory system: Secondary | ICD-10-CM | POA: Insufficient documentation

## 2012-08-28 DIAGNOSIS — F1010 Alcohol abuse, uncomplicated: Secondary | ICD-10-CM | POA: Insufficient documentation

## 2012-08-28 DIAGNOSIS — F329 Major depressive disorder, single episode, unspecified: Secondary | ICD-10-CM | POA: Insufficient documentation

## 2012-08-28 MED ORDER — TETANUS-DIPHTH-ACELL PERTUSSIS 5-2.5-18.5 LF-MCG/0.5 IM SUSP
0.5000 mL | Freq: Once | INTRAMUSCULAR | Status: AC
  Administered 2012-08-28: 0.5 mL via INTRAMUSCULAR
  Filled 2012-08-28: qty 0.5

## 2012-08-28 MED ORDER — CLINDAMYCIN PHOSPHATE 600 MG/50ML IV SOLN
600.0000 mg | Freq: Once | INTRAVENOUS | Status: AC
  Administered 2012-08-28: 600 mg via INTRAVENOUS
  Filled 2012-08-28: qty 50

## 2012-08-28 NOTE — ED Notes (Signed)
Pt. Has eaten breakfast without difficulty.  He had c/o right shoulder pain/tenderness--just got back from x-ray.  He is oriented x 4 with clear speech and is in no distress. I have just phoned report to [facility] and [facility]on in [place] and will transport him their now with con't. ecg monitoring.

## 2012-08-28 NOTE — ED Notes (Signed)
Pt fell last night at 9 pm and she hit her hand ,  Her left hand is swollen and left pinky is bent outward and laceration in crease.  Pain 10/10

## 2012-08-28 NOTE — ED Notes (Signed)
She has been very patient.  She tolerated Dr. Izora Ribas numbing, then reducing her fracture quite well.  He then applied sterile drsng.  She continues to be in good spirits and thanks [place] for our care.

## 2012-08-28 NOTE — Consult Note (Signed)
Reason for Consult:open fx, dislocation Referring Physician: [facility] CC: I hurt my finger [name] is an 62 y.o. right handed female.  HPI: pt was walking and fell onto outstretched hand last pm, c/o pain, deormity to sf, bleeding, presented to ER this am  Past Medical History  Diagnosis Date  . Hyperlipidemia   . Depression   . History of vertigo   . ETOH abuse   . 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 Arrhythmia     PAF ON FLECAINIDE  . Carotid artery occlusion     Past Surgical History  Procedure Laterality Date  . None      Family History  Problem Relation Age of Onset  . Heart disease Father     Social History:  reports that she has never smoked. She does not have any smokeless tobacco history on file. She reports that  drinks alcohol. She reports that she does not use illicit drugs.  Allergies:  Allergies  Allergen Reactions  . Sulfonamide Derivatives     REACTION: rash  . Cephalosporins Rash    Medications: I have reviewed the patient's current medications.  No results found for this or any previous visit (from the past 48 hour(s)).  Dg Hand Complete Left  08/28/2012   *RADIOLOGY REPORT*  Clinical Data: Post fall, now with laceration involving the tip of the proximal fifth phalanx.  LEFT HAND - COMPLETE 3+ VIEW  Comparison: None.  Findings:  The PIP joint of the fifth digit is dislocated medially with foreshortening.  There is a suspected displaced, comminuted fracture involving the articular surface of the base of the middle phalanx of the fifth digit.  A second adjacent soft tissue swelling.  No radiopaque foreign body.  No other fractures identified.  Degenerative changes of the STT joints of the base of the thumb.  IMPRESSION: Dislocation of the PIP joint of the fifth digit with suspected displaced fracture involving the articular surface of the base of the middle phalanx. No radiopaque foreign body.   Original Report Authenticated By: Tacey Ruiz, MD     Pertinent items are noted in HPI. Temp:  [98.3 F (36.8 C)] 98.3 F (36.8 C) (05/25 0628) Pulse Rate:  [70-77] 77 (05/25 0643) Resp:  [18-20] 20 (05/25 0643) BP: (100-129)/(78-80) 100/78 mmHg (05/25 0643) SpO2:  [97 %] 97 % (05/25 0643) General appearance: alert and cooperative Resp: clear to auscultation bilaterally Cardio: regular rate and rhythm GI: soft, non-tender; bowel sounds normal; no masses,  no organomegaly LUE - normal rom, n/v intact; RUE - wnl except for LRF, LSF, with some ecchimosis of LRF volarly around pipj and sometenderness, but full rom with some effort.  LSF with open laceration volarly at pipj with prox phalanx head exposed, ulnar deviation/disolcation of middle phalanx, distally n/v intact, good cap refill   Assessment/Plan: Open fx, dislocation of LSF, closed fx of LRF middle phalanx Plan: Discussed injury with patient in detail.  Discussed plan of wash out, relocation, exploration to determine degree of injury, soft tissue, ligament injury. Discussed risks of infection re dislocation, need for additional surgery.  Plan- will wash out and relocate dislocation.  Brennden Masten CHRISTOPHER 08/28/2012, 9:44 AM

## 2012-08-28 NOTE — ED Provider Notes (Signed)
Medical screening examination/treatment/procedure(s) were performed by non-physician practitioner and as supervising physician I was immediately available for consultation/collaboration.   Stephene Alegria M Ziare Orrick, DO 08/28/12 2036 

## 2012-08-28 NOTE — ED Provider Notes (Signed)
History     CSN: [id number]  Arrival date & time 08/28/12  0626   First MD Initiated Contact with Patient 08/28/12 [phone]      Chief Complaint  Patient presents with  . Hand Injury    (Consider location/radiation/quality/duration/timing/severity/associated sxs/prior treatment) HPI Pt is a 62yo female presenting with laceration to left 5th digit associated with swelling, pain, and deformity after fall onto hand last night.  Pt reports falling on back porch steps last night, landing on hand on cement.  Denies hitting head or LOC.  States pain is achy and throbbing, 8/10.  Ibuprofen helped minimally last night.  Has never injured this hand before.  Pt is right handed. Denies any other injury.  Last tetanus-unknown.    Past Medical History  Diagnosis Date  . Hyperlipidemia   . Depression   . History of vertigo   . ETOH abuse   . 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 Arrhythmia     PAF ON FLECAINIDE  . Carotid artery occlusion     Past Surgical History  Procedure Laterality Date  . None      Family History  Problem Relation Age of Onset  . Heart disease Father     History  Substance Use Topics  . Smoking status: Never Smoker   . Smokeless tobacco: Not on file  . Alcohol Use: Yes    OB History   Grav Para Term Preterm Abortions TAB SAB Ect Mult Living                  Review of Systems  Musculoskeletal: Positive for myalgias, joint swelling and arthralgias.  Skin: Positive for wound.  Neurological: Negative for dizziness, syncope, weakness, numbness and headaches.  All other systems reviewed and are negative.    Allergies  Sulfonamide derivatives and Cephalosporins  Home Medications   Current Outpatient Rx  Name  Route  Sig  Dispense  Refill  . aspirin 325 MG tablet   Oral   Take 325 mg by mouth daily.           . digoxin (LANOXIN) 0.125 MG tablet   Oral   Take 0.5 tablets (0.0625 mg total) by mouth daily.   30 tablet   11   . diltiazem  (CARDIZEM CD) 240 MG 24 hr capsule   Oral   Take 1 capsule (240 mg total) by mouth daily.   30 capsule   6   . flecainide (TAMBOCOR) 50 MG tablet      Take two tablets twice daily   120 tablet   12   . rosuvastatin (CRESTOR) 20 MG tablet   Oral   Take 10 mg by mouth daily.         . sertraline (ZOLOFT) 100 MG tablet      2 (two) times daily. Take one and one-half tablet daily           BP 100/78  Pulse 77  Temp(Src) 98.3 F (36.8 C) (Oral)  Resp 20  SpO2 97%  Physical Exam  Nursing note and vitals reviewed. Constitutional: She appears well-developed and well-nourished. No distress.  HENT:  Head: Normocephalic and atraumatic.  Eyes: Conjunctivae are normal. No scleral icterus.  Neck: Normal range of motion.  Cardiovascular: Normal rate, regular rhythm and normal heart sounds.   Pulmonary/Chest: Effort normal and breath sounds normal. No respiratory distress. She has no wheezes.  Musculoskeletal: She exhibits edema ( left 5th digit) and tenderness (  left 5th digit).       Hands: Edema and tenderness to left 5th digit.  Finger displaced toward ulna.  Sensation and circulation in tact.  Motion limited to pain and deformity.   CMS in tact for other 4 digit and rest of left hand.  Neurological: She is alert.  Skin: Skin is warm and dry. She is not diaphoretic.  Laceration between left 5th digit MCP and PIP. Bleeding controlled.  Psychiatric: She has a normal mood and affect. Her behavior is normal.    ED Course  Procedures (including critical care time)  Labs Reviewed - No data to display Dg Hand Complete Left  08/28/2012   *RADIOLOGY REPORT*  Clinical Data: Post fall, now with laceration involving the tip of the proximal fifth phalanx.  LEFT HAND - COMPLETE 3+ VIEW  Comparison: None.  Findings:  The PIP joint of the fifth digit is dislocated medially with foreshortening.  There is a suspected displaced, comminuted fracture involving the articular surface of the base  of the middle phalanx of the fifth digit.  A second adjacent soft tissue swelling.  No radiopaque foreign body.  No other fractures identified.  Degenerative changes of the STT joints of the base of the thumb.  IMPRESSION: Dislocation of the PIP joint of the fifth digit with suspected displaced fracture involving the articular surface of the base of the middle phalanx. No radiopaque foreign body.   Original Report Authenticated By: Tacey Ruiz, MD     1. Open fracture of phalanx of left little finger, initial encounter       MDM  Pt declined pain medicine during H&P.  Will give tetanus shot.  Plain films: dislocation of PIP joint of 5th digit, suspected displaced fx involving articular surface of base of middle phalanx. Discussed pt with Dr. Clarene Duke who consulted ortho.   Ortho Consult to Dr. Izora Ribas who agreed to come see patient.  Started on 600mg  IV clindamycin.   Dr. Izora Ribas advised pt when to f/u with him and prescribed her norco and clindamycin.   Pt discharged home in stable condition.  Vitals: unremarkable. Discharged in stable condition.    Discussed pt with attending during ED encounter.          Junius Finner, PA-C 08/28/12 1018

## 2012-09-08 ENCOUNTER — Other Ambulatory Visit: Payer: Self-pay | Admitting: *Deleted

## 2012-09-08 MED ORDER — ROSUVASTATIN CALCIUM 20 MG PO TABS: 10.0000 mg | ORAL_TABLET | Freq: Every day | ORAL | Status: DC

## 2012-11-01 ENCOUNTER — Other Ambulatory Visit: Payer: Self-pay | Admitting: Cardiology

## 2012-12-15 ENCOUNTER — Encounter: Payer: Self-pay | Admitting: Internal Medicine

## 2012-12-15 ENCOUNTER — Ambulatory Visit (INDEPENDENT_AMBULATORY_CARE_PROVIDER_SITE_OTHER): Payer: BC Managed Care – PPO | Admitting: Internal Medicine

## 2012-12-15 VITALS — BP 113/74 | HR 80 | Ht 63.0 in | Wt 158.0 lb

## 2012-12-15 DIAGNOSIS — I4891 Unspecified atrial fibrillation: Secondary | ICD-10-CM

## 2012-12-15 MED ORDER — DIGOXIN 125 MCG PO TABS: ORAL_TABLET | ORAL | Status: DC

## 2012-12-15 NOTE — Patient Instructions (Addendum)
Medication changes:  Taper your digoxin (lanoxin) as instructed  Your physician wants you to follow-up in: 8 months with Dr. Tenny Craw.  You will receive a reminder letter in the mail two months in advance. If you don't receive a letter, please call our office to schedule the follow-up appointment.  Establish with a primary care doctor. Dr. Rene Paci

## 2012-12-15 NOTE — Progress Notes (Signed)
HPI  Allergies  Allergen Reactions  . Sulfonamide Derivatives     REACTION: rash  . Cephalosporins Rash    Current Outpatient Prescriptions  Medication Sig Dispense Refill  . aspirin 325 MG tablet Take 325 mg by mouth daily.        . digoxin (LANOXIN) 0.125 MG tablet Take 0.5 tablets (0.0625 mg total) by mouth daily.  30 tablet  11  . diltiazem (CARDIZEM CD) 240 MG 24 hr capsule TAKE (1) CAPSULE DAILY.  30 capsule  6  . flecainide (TAMBOCOR) 50 MG tablet Take two tablets twice daily  120 tablet  12  . rosuvastatin (CRESTOR) 20 MG tablet Take 0.5 tablets (10 mg total) by mouth daily.  30 tablet  6  . sertraline (ZOLOFT) 100 MG tablet 2 (two) times daily. Take one and one-half tablet daily       No current facility-administered medications for this visit.    Past Medical History  Diagnosis Date  . Hyperlipidemia   . Depression   . History of vertigo   . ETOH abuse   . 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 Arrhythmia     PAF ON FLECAINIDE  . Carotid artery occlusion     Past Surgical History  Procedure Laterality Date  . None      Family History  Problem Relation Age of Onset  . Heart disease Father     History   Social History  . Marital Status: Single    Spouse Name: N/A    Number of Children: N/A  . Years of Education: N/A   Occupational History  . Not on file.   Social History Main Topics  . Smoking status: Never Smoker   . Smokeless tobacco: Not on file  . Alcohol Use: Yes  . Drug Use: No  . Sexual Activity: Not on file   Other Topics Concern  . Not on file   Social History Narrative  . No narrative on file    Review of Systems:  All systems reviewed.  They are negative to the above problem except as previously stated.  Vital Signs: BP 113/74  Pulse 80  Ht 5\' 3"  (1.6 m)  Wt 158 lb (71.668 kg)  BMI 28 kg/m2  Physical Exam  HEENT:  Normocephalic, atraumatic. EOMI, PERRLA.  Neck: JVP is normal.  No bruits.  Lungs: clear to  auscultation. No rales no wheezes.  Heart: Regular rate and rhythm. Normal S1, S2. No S3.   No significant murmurs. PMI not displaced.  Abdomen:  Supple, nontender. Normal bowel sounds. No masses. No hepatomegaly.  Extremities:   Good distal pulses throughout. No lower extremity edema.  Musculoskeletal :moving all extremities.  Neuro:   alert and oriented x3.  CN II-XII grossly intact.  EKG:  SR 80  PR 222 msec  Low voltage Assessment and Plan:

## 2012-12-15 NOTE — Progress Notes (Signed)
HPI:  Patient is a 62 yo who was previously followed by T Stuckey.  She has a history of atrial fibrillation.  She was hospitalized in 2006.  Had some sensation in ears but otherwise denies any symptoms with afib in past.   She denies SOB No CP  No palpitations.  Patient in for follow up.  She is doing well.  No major complaints.  She does not think she is out of rhythm.    Current Outpatient Prescriptions  Medication Sig Dispense Refill  . aspirin 325 MG tablet Take 325 mg by mouth daily.        . digoxin (LANOXIN) 0.125 MG tablet Taper as instructed      . diltiazem (CARDIZEM CD) 240 MG 24 hr capsule TAKE (1) CAPSULE DAILY.  30 capsule  6  . flecainide (TAMBOCOR) 50 MG tablet Take two tablets twice daily  120 tablet  12  . rosuvastatin (CRESTOR) 20 MG tablet Take 0.5 tablets (10 mg total) by mouth daily.  30 tablet  6  . sertraline (ZOLOFT) 100 MG tablet 2 (two) times daily. Take one and one-half tablet daily       No current facility-administered medications for this visit.    Allergies  Allergen Reactions  . Sulfonamide Derivatives     REACTION: rash  . Cephalosporins Rash    Past Medical History  Diagnosis Date  . Hyperlipidemia   . Depression   . History of vertigo   . ETOH abuse   . 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 Arrhythmia     PAF ON FLECAINIDE  . Carotid artery occlusion     Past Surgical History  Procedure Laterality Date  . None      Family History  Problem Relation Age of Onset  . Heart disease Father     History   Social History  . Marital Status: Single    Spouse Name: N/A    Number of Children: N/A  . Years of Education: N/A   Occupational History  . Not on file.   Social History Main Topics  . Smoking status: Never Smoker   . Smokeless tobacco: Not on file  . Alcohol Use: Yes  . Drug Use: No  . Sexual Activity: Not on file   Other Topics Concern  . Not on file   Social History Narrative  . No narrative on file     ROS: Please see the HPI.  All other systems reviewed and negative.  PHYSICAL EXAM:  BP 113/74  Pulse 80  Ht 5\' 3"  (1.6 m)  Wt 158 lb (71.668 kg)  BMI 28 kg/m2 Patient is in NAD General: Well developed, well nourished, in no acute distress. Head:  Normocephalic and atraumatic. Neck: no JVD Lungs: Clear to auscultation and percussion. Heart: Normal S1 and S2.  No murmur, rubs or gallops.  Abdomen:  Normal bowel sounds; soft; non tender; no organomegaly Pulses: Pulses normal in all 4 extremities. Extremities: No clubbing or cyanosis. No edema. Neurologic: Alert and oriented x 3  ECHO 2013  Study Conclusions  - Left ventricle: The cavity size was normal. Wall thickness was increased in a pattern of mild LVH. Systolic function was normal. The estimated ejection fraction was in the range of 60% to 65%. Wall motion was normal; there were no regional wall motion abnormalities. Doppler parameters are consistent with abnormal left ventricular relaxation (grade 1 diastolic dysfunction). - Mitral valve: Mild regurgitation. - Left atrium: The  atrium was mildly dilated. - Atrial septum: There was redundancy of the septum, with borderline criteria for aneurysm.  ekg SR  80  First degree AV block  222 msec.  Low voltage.  ASSESSMENT AND PLAN:  1. PAF  Patient has never really had symptoms even when in afib  I would keep on sam regimen except stop dig.   2.  HL  Keep on Crestor  Needs liipids checked  Patient does not have a primary care physician  I have given her name of V Leschber. I encouraged her to increase her activity level.

## 2013-03-08 ENCOUNTER — Other Ambulatory Visit: Payer: Self-pay | Admitting: Cardiology

## 2013-04-06 HISTORY — PX: CATARACT EXTRACTION W/ INTRAOCULAR LENS  IMPLANT, BILATERAL: SHX1307

## 2013-05-15 ENCOUNTER — Other Ambulatory Visit: Payer: Self-pay

## 2013-05-15 MED ORDER — FLECAINIDE ACETATE 50 MG PO TABS: ORAL_TABLET | ORAL | Status: DC

## 2013-05-30 ENCOUNTER — Other Ambulatory Visit: Payer: Self-pay | Admitting: Internal Medicine

## 2013-08-03 ENCOUNTER — Other Ambulatory Visit: Payer: Self-pay | Admitting: Internal Medicine

## 2013-08-04 ENCOUNTER — Encounter (INDEPENDENT_AMBULATORY_CARE_PROVIDER_SITE_OTHER): Payer: BC Managed Care – PPO | Admitting: Ophthalmology

## 2013-08-04 DIAGNOSIS — H35439 Paving stone degeneration of retina, unspecified eye: Secondary | ICD-10-CM

## 2013-08-04 DIAGNOSIS — H2510 Age-related nuclear cataract, unspecified eye: Secondary | ICD-10-CM

## 2013-08-04 DIAGNOSIS — H33309 Unspecified retinal break, unspecified eye: Secondary | ICD-10-CM

## 2013-08-04 DIAGNOSIS — H35419 Lattice degeneration of retina, unspecified eye: Secondary | ICD-10-CM

## 2013-08-07 ENCOUNTER — Encounter (INDEPENDENT_AMBULATORY_CARE_PROVIDER_SITE_OTHER): Payer: BC Managed Care – PPO | Admitting: Ophthalmology

## 2013-08-07 DIAGNOSIS — H33309 Unspecified retinal break, unspecified eye: Secondary | ICD-10-CM

## 2013-08-11 ENCOUNTER — Encounter (INDEPENDENT_AMBULATORY_CARE_PROVIDER_SITE_OTHER): Payer: BC Managed Care – PPO | Admitting: Ophthalmology

## 2013-08-11 DIAGNOSIS — H33309 Unspecified retinal break, unspecified eye: Secondary | ICD-10-CM

## 2013-08-23 ENCOUNTER — Ambulatory Visit (INDEPENDENT_AMBULATORY_CARE_PROVIDER_SITE_OTHER): Payer: BC Managed Care – PPO | Admitting: Ophthalmology

## 2013-08-23 ENCOUNTER — Other Ambulatory Visit: Payer: Self-pay | Admitting: Internal Medicine

## 2013-08-23 DIAGNOSIS — H33309 Unspecified retinal break, unspecified eye: Secondary | ICD-10-CM

## 2013-08-30 ENCOUNTER — Encounter (INDEPENDENT_AMBULATORY_CARE_PROVIDER_SITE_OTHER): Payer: BC Managed Care – PPO | Admitting: Ophthalmology

## 2013-08-30 DIAGNOSIS — H33309 Unspecified retinal break, unspecified eye: Secondary | ICD-10-CM

## 2013-08-31 ENCOUNTER — Other Ambulatory Visit: Payer: Self-pay | Admitting: Internal Medicine

## 2013-09-13 ENCOUNTER — Encounter (INDEPENDENT_AMBULATORY_CARE_PROVIDER_SITE_OTHER): Payer: BC Managed Care – PPO | Admitting: Ophthalmology

## 2013-09-13 DIAGNOSIS — H33309 Unspecified retinal break, unspecified eye: Secondary | ICD-10-CM

## 2013-09-13 DIAGNOSIS — H35419 Lattice degeneration of retina, unspecified eye: Secondary | ICD-10-CM

## 2013-09-13 DIAGNOSIS — H43819 Vitreous degeneration, unspecified eye: Secondary | ICD-10-CM

## 2013-09-13 DIAGNOSIS — H2510 Age-related nuclear cataract, unspecified eye: Secondary | ICD-10-CM

## 2013-09-24 ENCOUNTER — Other Ambulatory Visit: Payer: Self-pay | Admitting: Internal Medicine

## 2013-10-03 ENCOUNTER — Other Ambulatory Visit: Payer: Self-pay | Admitting: Internal Medicine

## 2013-10-03 ENCOUNTER — Other Ambulatory Visit: Payer: Self-pay | Admitting: Cardiology

## 2013-11-01 ENCOUNTER — Other Ambulatory Visit: Payer: Self-pay | Admitting: Internal Medicine

## 2013-11-10 ENCOUNTER — Ambulatory Visit (INDEPENDENT_AMBULATORY_CARE_PROVIDER_SITE_OTHER): Payer: BC Managed Care – PPO | Admitting: Ophthalmology

## 2013-11-10 DIAGNOSIS — H2510 Age-related nuclear cataract, unspecified eye: Secondary | ICD-10-CM

## 2013-11-10 DIAGNOSIS — H33309 Unspecified retinal break, unspecified eye: Secondary | ICD-10-CM

## 2013-11-10 DIAGNOSIS — H35379 Puckering of macula, unspecified eye: Secondary | ICD-10-CM

## 2013-11-10 DIAGNOSIS — H43819 Vitreous degeneration, unspecified eye: Secondary | ICD-10-CM

## 2013-11-14 ENCOUNTER — Encounter: Payer: Self-pay | Admitting: Internal Medicine

## 2013-11-22 ENCOUNTER — Other Ambulatory Visit: Payer: Self-pay | Admitting: Internal Medicine

## 2013-12-02 ENCOUNTER — Other Ambulatory Visit: Payer: Self-pay | Admitting: Internal Medicine

## 2013-12-03 ENCOUNTER — Other Ambulatory Visit: Payer: Self-pay | Admitting: Internal Medicine

## 2013-12-15 ENCOUNTER — Ambulatory Visit (INDEPENDENT_AMBULATORY_CARE_PROVIDER_SITE_OTHER): Payer: BC Managed Care – PPO | Admitting: Internal Medicine

## 2013-12-15 ENCOUNTER — Encounter: Payer: Self-pay | Admitting: Internal Medicine

## 2013-12-15 ENCOUNTER — Ambulatory Visit: Payer: BC Managed Care – PPO | Admitting: Internal Medicine

## 2013-12-15 VITALS — BP 114/70 | HR 77 | Ht 63.0 in | Wt 165.0 lb

## 2013-12-15 DIAGNOSIS — E7800 Pure hypercholesterolemia, unspecified: Secondary | ICD-10-CM

## 2013-12-15 DIAGNOSIS — I4891 Unspecified atrial fibrillation: Secondary | ICD-10-CM

## 2013-12-15 NOTE — Progress Notes (Signed)
   HPI:  Patient is a 63 yo who was previously followed by T Stuckey.  She has a history of atrial fibrillation.  She was hospitalized in 2006.  Had some sensation in ears but otherwise denies any symptoms with afib in past.   I saw the patinet in Fall 2014 Since seen she has done well No CP No SOB NO palpitations though she really didn't have them in past She does not exercise or walk regularly Current Outpatient Prescriptions  Medication Sig Dispense Refill  . aspirin 325 MG tablet Take 325 mg by mouth daily.        . CRESTOR 20 MG tablet TAKE (1/2) TABLET DAILY.  15 tablet  0  . diltiazem (CARDIZEM CD) 240 MG 24 hr capsule TAKE (1) CAPSULE DAILY.  30 capsule  0  . flecainide (TAMBOCOR) 50 MG tablet TAKE (2) TABLETS TWICE DAILY.  120 tablet  0  . sertraline (ZOLOFT) 100 MG tablet Take two tabs daily       No current facility-administered medications for this visit.    Allergies  Allergen Reactions  . Sulfonamide Derivatives     REACTION: rash  . Cephalosporins Rash    Past Medical History  Diagnosis Date  . Hyperlipidemia   . Depression   . History of vertigo   . ETOH abuse   . 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 Arrhythmia     PAF ON FLECAINIDE  . Carotid artery occlusion     Past Surgical History  Procedure Laterality Date  . None      Family History  Problem Relation Age of Onset  . Heart disease Father     History   Social History  . Marital Status: Single    Spouse Name: N/A    Number of Children: N/A  . Years of Education: N/A   Occupational History  . Not on file.   Social History Main Topics  . Smoking status: Never Smoker   . Smokeless tobacco: Not on file  . Alcohol Use: Yes  . Drug Use: No  . Sexual Activity: Not on file   Other Topics Concern  . Not on file   Social History Narrative  . No narrative on file    ROS: Please see the HPI.  All other systems reviewed and negative.  PHYSICAL EXAM:  BP 114/70  Pulse 77  Ht 5'  3" (1.6 m)  Wt 165 lb (74.844 kg)  BMI 29.24 kg/m2 Patient is in NAD General: Well developed, well nourished, in no acute distress. Head:  Normocephalic and atraumatic. Neck: no JVD Lungs: Clear to auscultation and percussion. Heart: Normal S1 and S2.  No murmur, rubs or gallops.  Abdomen:  Normal bowel sounds; soft; non tender; no organomegaly Pulses: Pulses normal in all 4 extremities. Extremities: No clubbing or cyanosis. No edema. Neurologic: Alert and oriented x 3   ASSESSMENT AND PLAN:  1. PAF   No symptoms  Keep on current regimen  Check labs   2.  HL  Keep on Crestor  Check lipids  3.  HCM  Walk/exercise

## 2013-12-15 NOTE — Patient Instructions (Signed)
Your physician recommends that you continue on your current medications as directed. Please refer to the Current Medication list given to you today. Your physician recommends that you return for FASTING lab work ON 12/27/13 --LIPIDS, AST, CBC, BMET Your physician wants you to follow-up in: [place].  You will receive a reminder letter in the mail two months in advance. If you don't receive a letter, please call our office to schedule the follow-up appointment.

## 2013-12-21 ENCOUNTER — Other Ambulatory Visit: Payer: Self-pay | Admitting: Internal Medicine

## 2013-12-27 ENCOUNTER — Other Ambulatory Visit (INDEPENDENT_AMBULATORY_CARE_PROVIDER_SITE_OTHER): Payer: BC Managed Care – PPO

## 2013-12-27 DIAGNOSIS — I4891 Unspecified atrial fibrillation: Secondary | ICD-10-CM

## 2013-12-27 DIAGNOSIS — E7800 Pure hypercholesterolemia, unspecified: Secondary | ICD-10-CM

## 2013-12-27 LAB — LIPID PANEL
Cholesterol: 196 mg/dL (ref 0–200)
HDL: 70 mg/dL (ref >39.00)
LDL Cholesterol: 102 mg/dL — ABNORMAL HIGH (ref 0–99)
NONHDL: 126
Total CHOL/HDL Ratio: 3
Triglycerides: 122 mg/dL (ref 0.0–149.0)
VLDL: 24.4 mg/dL (ref 0.0–40.0)

## 2013-12-27 LAB — CBC
HCT: 40.7 % (ref 36.0–46.0)
Hemoglobin: 13.8 g/dL (ref 12.0–15.0)
MCHC: 33.8 g/dL (ref 30.0–36.0)
MCV: 97.3 fl (ref 78.0–100.0)
PLATELETS: 178 10*3/uL (ref 150.0–400.0)
RBC: 4.18 Mil/uL (ref 3.87–5.11)
RDW: 12.7 % (ref 11.5–15.5)
WBC: 7.1 10*3/uL (ref 4.0–10.5)

## 2013-12-27 LAB — AST: AST: 35 U/L (ref 0–37)

## 2014-01-01 ENCOUNTER — Other Ambulatory Visit: Payer: Self-pay | Admitting: Internal Medicine

## 2014-02-05 ENCOUNTER — Telehealth: Payer: Self-pay | Admitting: Internal Medicine

## 2014-02-05 NOTE — Telephone Encounter (Signed)
New message     Dr Harrington Challenger had recommended a [facility] PCP to pt but she forgot the name.  Who does Dr Harrington Challenger recommend?

## 2014-02-05 NOTE — Telephone Encounter (Signed)
Discussed with Dr. Harrington Challenger

## 2014-02-08 NOTE — Telephone Encounter (Signed)
Follow up     Pt is calling back.  She want to get a PCP recommendation from Dr Harrington Challenger

## 2014-02-08 NOTE — Telephone Encounter (Signed)
Reached voicemail at work # for [employer].  No message left. Unable to reach anyone at home #. Phone rang once, sounds like open line but no one answers.

## 2014-02-09 NOTE — Telephone Encounter (Signed)
Informed patient.   She was thankful and appreciative for the call.

## 2014-05-02 ENCOUNTER — Encounter: Payer: Self-pay | Admitting: Internal Medicine

## 2014-05-02 ENCOUNTER — Ambulatory Visit (INDEPENDENT_AMBULATORY_CARE_PROVIDER_SITE_OTHER): Payer: BC Managed Care – PPO | Admitting: Internal Medicine

## 2014-05-02 VITALS — BP 122/60 | Temp 98.0°F | Ht 60.75 in | Wt 162.5 lb

## 2014-05-02 DIAGNOSIS — E785 Hyperlipidemia, unspecified: Secondary | ICD-10-CM

## 2014-05-02 DIAGNOSIS — Z8679 Personal history of other diseases of the circulatory system: Secondary | ICD-10-CM

## 2014-05-02 DIAGNOSIS — F1090 Alcohol use, unspecified, uncomplicated: Secondary | ICD-10-CM

## 2014-05-02 DIAGNOSIS — Z789 Other specified health status: Secondary | ICD-10-CM

## 2014-05-02 DIAGNOSIS — Z9889 Other specified postprocedural states: Secondary | ICD-10-CM

## 2014-05-02 DIAGNOSIS — F329 Major depressive disorder, single episode, unspecified: Secondary | ICD-10-CM

## 2014-05-02 DIAGNOSIS — Z85820 Personal history of malignant melanoma of skin: Secondary | ICD-10-CM

## 2014-05-02 DIAGNOSIS — F32A Depression, unspecified: Secondary | ICD-10-CM

## 2014-05-02 MED ORDER — SERTRALINE HCL 100 MG PO TABS: 50.0000 mg | ORAL_TABLET | Freq: Every day | ORAL | Status: DC

## 2014-05-02 NOTE — Patient Instructions (Signed)
  Restart the sertraline at 50 mg per day  We can adjust and consider other meds as needed.   etoh can aggravated depression. And a fib . Plan wellness visit with full set of labs  Pap  Get a mammogram .    Healthy lifestyle includes : At least 150 minutes of exercise weeks  , weight at healthy levels, which is usually   BMI 19-25. Avoid trans fats and processed foods;  Increase fresh fruits and veges to 5 servings per day. And avoid sweet beverages including tea and juice. Mediterranean diet with olive oil and nuts have been noted to be heart and brain healthy . Avoid tobacco products . Limit  alcohol to  7 per week for women and 14 servings for men.  Get adequate sleep . Wear seat belts . Don't text and drive .

## 2014-05-02 NOTE — Progress Notes (Signed)
Pre visit review using our clinic review tool, if applicable. No additional management support is needed unless otherwise documented below in the visit note.  Chief Complaint  Patient presents with  . Establish Care    depression    HPI: Patient  [name]  64 y.o. comes in today for new patient visit .orif from [place].   Previous care from  Her card no pcp for a while  Carries dx isolated AFIb  Elevated LIPIDS  followed by Dr Harrington Challenger . Apparently nosx    Prev hosp 2006 for this and on medication management .Last  pcpc dr Marilynne Drivers.     Hx of seeing dr Reece Levy in the past  On sertaline and stopped  On own "didn't do any good" months ago  sister died x mas  2023/04/17   Has therapist Karin Golden.   advise  Ssri. Restart.   She uses etoh 6-8 beers per night for years  Denies neuropathy dts black outs.   Father etoh and bipolar  Died in 42s were separated .  Mom passed in 86s  Dementia.   Sister was bipolar heavy smoker . dont know what died of.   Health Maintenance  Topic Date Due  . PAP SMEAR  12/20/1968  . MAMMOGRAM  05/18/2009  . ZOSTAVAX  12/21/2010  . INFLUENZA VACCINE  11/05/2014  . COLONOSCOPY  05/31/2019  . TETANUS/TDAP  08/29/2022   Health Maintenance Review LIFESTYLE:  Exercise:  Recent .  Walking  Tobacco/ETS: Alcohol: too much 6-8 per day.  For year.s.  Beer   Sugar beverages: hot tea  Some  .sweet tea Sleep: 6-7  Drug use: no Colonoscopy:  utd 2011 PAP:  ? When was done .  MAMMO: ? When due .  Over 2 year   ROS:  GEN/ HEENT: No fever, significant weight changes sweats headaches vision problems hearing changes, CV/ PULM; No chest pain shortness of breath cough, syncope,edema  change in exercise tolerance. GI /GU: No adominal pain, vomiting, change in bowel habits. No blood in the stool. No significant GU symptoms. SKIN/HEME: ,no acute skin rashes suspicious lesions or bleeding. No lymphadenopathy, nodules, masses.  NEURO/ PSYCH:  No neurologic signs  such as weakness numbness. IMM/ Allergy: No unusual infections.  Allergy .  seasonal REST of 12 system review negative except as per HPI Remote hx renal stones   Past Medical History  Diagnosis Date  . Hyperlipidemia   . Depression   . History of vertigo   . ETOH abuse   . 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 Arrhythmia     PAF ON FLECAINIDE  . Carotid artery occlusion   . Hx of varicella   . Seasonal allergies   . Kidney stones     remote  . Hx: UTI (urinary tract infection)     Past Surgical History  Procedure Laterality Date  . Melanoma excision  2010    Left Cheek  . Cataract  2015    Both eyes    Family History  Problem Relation Age of Onset  . Heart disease Father   . Alcohol abuse Father   . Bipolar disorder Father   . Dementia Mother   . Arthritis Mother   . Bipolar disorder Sister     father also   . Stroke Maternal Grandfather   . Heart disease Maternal Grandfather   . Lung cancer Paternal Grandmother     History   Social  History  . Marital Status: Single    Spouse Name: N/A    Number of Children: N/A  . Years of Education: N/A   Social History Main Topics  . Smoking status: Never Smoker   . Smokeless tobacco: Never Used  . Alcohol Use: 0.0 oz/week    0 Not specified per week  . Drug Use: No  . Sexual Activity: No   Other Topics Concern  . 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    Drinks 6-8 beers on most days.  Believes she has an alcohol abuse problem.    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     Outpatient Encounter Prescriptions as of 05/02/2014  Medication Sig  . aspirin 325 MG tablet Take 325 mg by mouth daily.    . CRESTOR 20 MG tablet TAKE (1/2) TABLET DAILY.  Marland Kitchen diltiazem (CARDIZEM CD) 240 MG 24 hr capsule TAKE (1) CAPSULE DAILY.  . flecainide  (TAMBOCOR) 50 MG tablet TAKE (2) TABLETS TWICE DAILY.  Marland Kitchen sertraline (ZOLOFT) 100 MG tablet Take 0.5 tablets (50 mg total) by mouth daily.  . [DISCONTINUED] sertraline (ZOLOFT) 100 MG tablet Take two tabs daily    EXAM:  BP 122/60 mmHg  Temp(Src) 98 F (36.7 C) (Oral)  Ht 5' 0.75" (1.543 m)  Wt 162 lb 8 oz (73.71 kg)  BMI 30.96 kg/m2  Body mass index is 30.96 kg/(m^2).  Physical Exam: Vital signs reviewed GUR:KYHC is a well-developed well-nourished alert cooperative    who appearsr stated age in no acute distress.  HEENT: normocephalic atraumatic , Eyes: PERRL EOM's full, conjunctiva clear, Nares: paten,t no deformity discharge or tenderness., Ears: no deformity EAC's clear TMs with normal landmarks. Mouth: clear OP, no lesions, edema.  Moist mucous membranes. Dentition in adequate repair. NECK: supple without masses, thyromegaly or bruits. CHEST/PULM:  Clear to auscultation  breath sounds equal no wheeze , rales or rhonchi. No chest wall deformities or tenderness. CV: PMI is nondisplaced, S1 S2 no gallops, murmurs, rubs. Peripheral pulses are full without delay.No JVD .  ABDOMEN: Bowel sounds normal nontender  No guard or rebound, no hepato splenomegal no CVA tenderness.  Extremtities:  No clubbing cyanosis or edema, no acute joint swelling or redness no focal atrophy NEURO:  Oriented x3, cranial nerves 3-12 appear to be intact, no obvious focal weakness,gait within normal limits no abnormal reflexes or asymmetrical SKIN: No acute rashes normal turgor, color, no bruising or petechiae. PSYCH: Oriented, good eye contact, cognition and judgment appear normal. LN: no cervical adenopathy phq 9   7 3. Feeling bad about self    ,  2  Interest depression fatigu concentration,  1 suicidal thoughts  Very difficult  Lab Results  Component Value Date   WBC 7.1 12/27/2013   HGB 13.8 12/27/2013   HCT 40.7 12/27/2013   PLT 178.0 12/27/2013   CHOL 196 12/27/2013   TRIG 122.0 12/27/2013   HDL  70.00 12/27/2013   LDLDIRECT 124.3 05/31/2008   LDLCALC 102* 12/27/2013   ALT 27 02/08/2012   AST 35 12/27/2013    ASSESSMENT AND PLAN:  Discussed the following assessment and plan:  Depression - not felt to be bipolar hx psyc eval in past seeing dr Earma [place] psychologist aware of etoh adding to  problem   Hyperlipidemia  Heavy alcohol use - health risk no hx dts  or lega problems advise disc and dec etc fam hx etohabuse  History of atrial fibrillation  Hx of melanoma excision Lack of preventive services and primary care for a while Consider other meds as needed for depression ? wellbutrin other ssri or snri Plan cpx pap labs etc  Patient Care Team: Burnis Medin, MD as PCP - General (Internal Medicine) Fay Records, MD as Consulting Physician (Cardiology) Alveda Reasons, PHD Johna Sheriff, MD as Consulting Physician (Ophthalmology) Danella Sensing, MD as Consulting Physician (Dermatology) Danella Sensing, MD as Consulting Physician (Dermatology) Patient Instructions   Restart the sertraline at 50 mg per day  We can adjust and consider other meds as needed.   etoh can aggravated depression. And a fib . Plan wellness visit with full set of labs  Pap  Get a mammogram .    Healthy lifestyle includes : At least 150 minutes of exercise weeks  , weight at healthy levels, which is usually   BMI 19-25. Avoid trans fats and processed foods;  Increase fresh fruits and veges to 5 servings per day. And avoid sweet beverages including tea and juice. Mediterranean diet with olive oil and nuts have been noted to be heart and brain healthy . Avoid tobacco products . Limit  alcohol to  7 per week for women and 14 servings for men.  Get adequate sleep . Wear seat belts . Don't text and drive .     Standley Brooking. Cypress Hinkson M.D.

## 2014-05-30 ENCOUNTER — Other Ambulatory Visit (INDEPENDENT_AMBULATORY_CARE_PROVIDER_SITE_OTHER): Payer: BC Managed Care – PPO

## 2014-05-30 DIAGNOSIS — Z0000 Encounter for general adult medical examination without abnormal findings: Secondary | ICD-10-CM

## 2014-05-30 LAB — CBC WITH DIFFERENTIAL/PLATELET
Basophils Absolute: 0 10*3/uL (ref 0.0–0.1)
Basophils Relative: 0.4 % (ref 0.0–3.0)
EOS PCT: 1.6 % (ref 0.0–5.0)
Eosinophils Absolute: 0.1 10*3/uL (ref 0.0–0.7)
HEMATOCRIT: 41.6 % (ref 36.0–46.0)
Hemoglobin: 14.3 g/dL (ref 12.0–15.0)
LYMPHS ABS: 1.1 10*3/uL (ref 0.7–4.0)
Lymphocytes Relative: 14.2 % (ref 12.0–46.0)
MCHC: 34.4 g/dL (ref 30.0–36.0)
MCV: 97.8 fl (ref 78.0–100.0)
MONOS PCT: 5 % (ref 3.0–12.0)
Monocytes Absolute: 0.4 10*3/uL (ref 0.1–1.0)
NEUTROS PCT: 78.8 % — AB (ref 43.0–77.0)
Neutro Abs: 6.1 10*3/uL (ref 1.4–7.7)
PLATELETS: 172 10*3/uL (ref 150.0–400.0)
RBC: 4.26 Mil/uL (ref 3.87–5.11)
RDW: 12.6 % (ref 11.5–15.5)
WBC: 7.8 10*3/uL (ref 4.0–10.5)

## 2014-05-30 LAB — TSH: TSH: 1.57 u[IU]/mL (ref 0.35–4.50)

## 2014-05-30 LAB — LIPID PANEL
CHOLESTEROL: 167 mg/dL (ref 0–200)
HDL: 76.7 mg/dL (ref >39.00)
LDL Cholesterol: 65 mg/dL (ref 0–99)
NonHDL: 90.3
Total CHOL/HDL Ratio: 2
Triglycerides: 127 mg/dL (ref 0.0–149.0)
VLDL: 25.4 mg/dL (ref 0.0–40.0)

## 2014-05-30 LAB — COMPREHENSIVE METABOLIC PANEL
ALBUMIN: 4.3 g/dL (ref 3.5–5.2)
ALK PHOS: 68 U/L (ref 39–117)
ALT: 26 U/L (ref 0–35)
AST: 34 U/L (ref 0–37)
BUN: 13 mg/dL (ref 6–23)
CALCIUM: 9.4 mg/dL (ref 8.4–10.5)
CHLORIDE: 106 meq/L (ref 96–112)
CO2: 28 meq/L (ref 19–32)
Creatinine, Ser: 0.71 mg/dL (ref 0.40–1.20)
GFR: 88.24 mL/min (ref >60.00)
GLUCOSE: 103 mg/dL — AB (ref 70–99)
POTASSIUM: 4.1 meq/L (ref 3.5–5.1)
SODIUM: 141 meq/L (ref 135–145)
TOTAL PROTEIN: 7.1 g/dL (ref 6.0–8.3)
Total Bilirubin: 0.8 mg/dL (ref 0.2–1.2)

## 2014-06-05 ENCOUNTER — Encounter: Payer: Self-pay | Admitting: Internal Medicine

## 2014-06-05 ENCOUNTER — Other Ambulatory Visit (HOSPITAL_COMMUNITY)
Admission: RE | Admit: 2014-06-05 | Discharge: 2014-06-05 | Disposition: A | Discharge status: Home / Self Care | Payer: BC Managed Care – PPO | Source: Ambulatory Visit | Attending: Internal Medicine | Admitting: Internal Medicine

## 2014-06-05 ENCOUNTER — Ambulatory Visit (INDEPENDENT_AMBULATORY_CARE_PROVIDER_SITE_OTHER): Payer: BC Managed Care – PPO | Admitting: Internal Medicine

## 2014-06-05 VITALS — BP 126/72 | Temp 98.3°F | Ht 60.75 in | Wt 160.0 lb

## 2014-06-05 DIAGNOSIS — F329 Major depressive disorder, single episode, unspecified: Secondary | ICD-10-CM

## 2014-06-05 DIAGNOSIS — Z1151 Encounter for screening for human papillomavirus (HPV): Secondary | ICD-10-CM | POA: Insufficient documentation

## 2014-06-05 DIAGNOSIS — Z01419 Encounter for gynecological examination (general) (routine) without abnormal findings: Secondary | ICD-10-CM | POA: Diagnosis not present

## 2014-06-05 DIAGNOSIS — Z79899 Other long term (current) drug therapy: Secondary | ICD-10-CM

## 2014-06-05 DIAGNOSIS — E785 Hyperlipidemia, unspecified: Secondary | ICD-10-CM

## 2014-06-05 DIAGNOSIS — M21961 Unspecified acquired deformity of right lower leg: Secondary | ICD-10-CM

## 2014-06-05 DIAGNOSIS — F32A Depression, unspecified: Secondary | ICD-10-CM

## 2014-06-05 DIAGNOSIS — Z0000 Encounter for general adult medical examination without abnormal findings: Secondary | ICD-10-CM

## 2014-06-05 MED ORDER — SERTRALINE HCL 100 MG PO TABS: 100.0000 mg | ORAL_TABLET | Freq: Every day | ORAL | Status: DC

## 2014-06-05 NOTE — Progress Notes (Signed)
Pre visit review using our clinic review tool, if applicable. No additional management support is needed unless otherwise documented below in the visit note.  Chief Complaint  Patient presents with  . preventive visit    HPI: Patient  [name]  64 y.o. comes in today for Preventive Health Care visit  See psat note to establish . Cards  2 x per year.  Stable  Problem with right foot  Toe overriding and causing problem  Has increasing bunion Depression about the same as before.   Health Maintenance  Topic Date Due  . HIV Screening  12/20/1965  . MAMMOGRAM  05/18/2009  . ZOSTAVAX  12/21/2010  . INFLUENZA VACCINE  11/05/2014  . PAP SMEAR  06/04/2017  . COLONOSCOPY  05/31/2019  . TETANUS/TDAP  08/29/2022   Health Maintenance Review LIFESTYLE:  Exercise:  tring to walk  So far  Tobacco/ETS: Alcohol: per day  Sugar beverages: Sleep: not wee k  Drug use: no Colonoscopy:  PAP: due  No hx of problems high risk sx  MAMMO:   ROS:  GEN/ HEENT: No fever, significant weight changes sweats headaches vision problems hearing changes, CV/ PULM; No chest pain shortness of breath cough, syncope,edema  change in exercise tolerance. GI /GU: No adominal pain, vomiting, change in bowel habits. No blood in the stool. No significant GU symptoms. SKIN/HEME: ,no acute skin rashes suspicious lesions or bleeding. No lymphadenopathy, nodules, masses.  NEURO/ PSYCH:  No neurologic signs such as weakness numbness. No depression anxiety. IMM/ Allergy: No unusual infections.  Allergy .   REST of 12 system review negative except as per HPI   Past Medical History  Diagnosis Date  . Hyperlipidemia   . Depression   . History of vertigo   . ETOH abuse   . 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 Arrhythmia     PAF ON FLECAINIDE  . Carotid artery occlusion   . Hx of varicella   . Seasonal allergies   . Kidney stones     remote  . Hx: UTI (urinary tract infection)     Past Surgical  History  Procedure Laterality Date  . Melanoma excision  2010    Left Cheek  . Cataract  2015    Both eyes    Family History  Problem Relation Age of Onset  . Heart disease Father   . Alcohol abuse Father   . Bipolar disorder Father   . Dementia Mother   . Arthritis Mother   . Bipolar disorder Sister     father also   . Stroke Maternal Grandfather   . Heart disease Maternal Grandfather   . Lung cancer Paternal Grandmother     History   Social History  . Marital Status: Single    Spouse Name: N/A  . Number of Children: N/A  . Years of Education: N/A   Social History Main Topics  . Smoking status: Never Smoker   . Smokeless tobacco: Never Used  . Alcohol Use: 0.0 oz/week    0 Standard drinks or equivalent per week  . Drug Use: No  . Sexual Activity: No   Other Topics Concern  . 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    Drinks 6-8 beers on most days.  Believes she has an alcohol  abuse problem.    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     Outpatient Encounter Prescriptions as of 06/05/2014  Medication Sig  . aspirin 325 MG tablet Take 325 mg by mouth daily.    . CRESTOR 20 MG tablet TAKE (1/2) TABLET DAILY.  Marland Kitchen diltiazem (CARDIZEM CD) 240 MG 24 hr capsule TAKE (1) CAPSULE DAILY.  . flecainide (TAMBOCOR) 50 MG tablet TAKE (2) TABLETS TWICE DAILY.  Marland Kitchen sertraline (ZOLOFT) 100 MG tablet Take 1 tablet (100 mg total) by mouth daily.  . [DISCONTINUED] sertraline (ZOLOFT) 100 MG tablet Take 0.5 tablets (50 mg total) by mouth daily.    EXAM:  BP 126/72 mmHg  Temp(Src) 98.3 F (36.8 C) (Oral)  Ht 5' 0.75" (1.543 m)  Wt 160 lb (72.576 kg)  BMI 30.48 kg/m2  Body mass index is 30.48 kg/(m^2).  Physical Exam: Vital signs reviewed NWG:NFAO is a well-developed well-nourished alert cooperative     who appearsr stated age in no acute distress.  HEENT: normocephalic atraumatic , Eyes: PERRL EOM's full, conjunctiva clear, Nares: paten,t no deformity discharge or tenderness., Ears: no deformity EAC's clear TMs with normal landmarks. Mouth: clear OP, no lesions, edema.  Moist mucous membranes. Dentition in adequate repair. NECK: supple without masses, thyromegaly or bruits. CHEST/PULM:  Clear to auscultation and percussion breath sounds equal no wheeze , rales or rhonchi. No chest wall deformities or tenderness. CV: PMI is nondisplaced, S1 S2 no gallops, murmurs, rubs. Peripheral pulses are full without delay.No JVD .  Breast: normal by inspection . No dimpling, discharge, masses, tenderness or discharge . Right reported some itchy slight superfical white scale no sking changes   No discharger mass ABDOMEN: Bowel sounds normal nontender  No guard or rebound, no hepato splenomegal no CVA tenderness.  No hernia. Extremtities:  No clubbing cyanosis or edema, no acute joint swelling or redness no focal atrophy feet with overriding second toe  Right bunion with this no ulcer  NEURO:  Oriented x3, cranial nerves 3-12 appear to be intact, no obvious focal weakness,gait within normal limits no abnormal reflexes or asymmetrical SKIN: No acute rashes normal turgor, color, no bruising or petechiae. PSYCH: Oriented, good eye contact, no obvious  anxiety, cognition and judgment appear normal. Slightly depression  Affect  LN: no cervical axillary inguinal adenopathy Pelvic: NL ext GU, labia clear without lesions or rash . Vagina no lesions .Cervix: clear  Inverted not fully visualized nl to palpation UTERUS: Neg CMT Adnexa:  clear no masses . PAP done rectal neg stool heme    Lab Results  Component Value Date   WBC 7.8 05/30/2014   HGB 14.3 05/30/2014   HCT 41.6 05/30/2014   PLT 172.0 05/30/2014   GLUCOSE 103* 05/30/2014   CHOL 167 05/30/2014   TRIG 127.0 05/30/2014   HDL 76.70 05/30/2014   LDLDIRECT  124.3 05/31/2008   LDLCALC 65 05/30/2014   ALT 26 05/30/2014   AST 34 05/30/2014   NA 141 05/30/2014   K 4.1 05/30/2014   CL 106 05/30/2014   CREATININE 0.71 05/30/2014   BUN 13 05/30/2014   CO2 28 05/30/2014   TSH 1.57 05/30/2014    ASSESSMENT AND PLAN:  Discussed the following assessment and plan:  Visit for preventive health examination - Plan: PAP [[place]]  Encounter for routine gynecological examination - Plan: PAP []  Depression - intensify medication.  Hyperlipidemia - meds  no se noted  Foot deformity, acquired, right - overriding toe and bunion  advise podiatry  check   Medication management Labs good  X bg lsi  itghy breast x few weeks off and on dousbt breast pathology but get mammogram overdue and fu if needed Increase sertraline to 100 mg  Cont counseling and fu in about 2 months or earlier  if needed  Patient Care Team: Burnis Medin, MD as PCP - General (Internal Medicine) Fay Records, MD as Consulting Physician (Cardiology) Alveda Reasons, PHD Monna Fam, MD as Consulting Physician (Ophthalmology) Danella Sensing, MD as Consulting Physician (Dermatology) Danella Sensing, MD as Consulting Physician (Dermatology) Patient Instructions   Get a mammogram  [place]  Can make your own appt. Will notify you when pap results are available. Labs are good except slighly elevated glucose   Healthy lsife style  ,mediterranean diet is helpful. Get podiatry to see you about right foot  Let us know if we need to facilitate that referral . Wellness check in a year or as needed    Increase sertaline to 100 mg  Per day and ROV  In 2 months        Danaisha Celli K. Sanari Offner M.D.

## 2014-06-05 NOTE — Patient Instructions (Signed)
  Get a mammogram  [place]  Can make your own appt. Will notify you when pap results are available. Labs are good except slighly elevated glucose   Healthy lsife style  ,mediterranean diet is helpful. Get podiatry to see you about right foot  Let us know if we need to facilitate that referral . Wellness check in a year or as needed    Increase sertaline to 100 mg  Per day and ROV  In 2 months

## 2014-06-07 LAB — CYTOLOGY - PAP

## 2014-06-14 DIAGNOSIS — E785 Hyperlipidemia, unspecified: Secondary | ICD-10-CM | POA: Insufficient documentation

## 2014-06-14 DIAGNOSIS — F329 Major depressive disorder, single episode, unspecified: Secondary | ICD-10-CM | POA: Insufficient documentation

## 2014-06-14 DIAGNOSIS — F32A Depression, unspecified: Secondary | ICD-10-CM | POA: Insufficient documentation

## 2014-06-14 DIAGNOSIS — M21969 Unspecified acquired deformity of unspecified lower leg: Secondary | ICD-10-CM | POA: Insufficient documentation

## 2014-06-19 ENCOUNTER — Other Ambulatory Visit: Payer: Self-pay | Admitting: Internal Medicine

## 2014-06-21 ENCOUNTER — Encounter (INDEPENDENT_AMBULATORY_CARE_PROVIDER_SITE_OTHER): Payer: BC Managed Care – PPO | Admitting: Ophthalmology

## 2014-06-21 DIAGNOSIS — H43813 Vitreous degeneration, bilateral: Secondary | ICD-10-CM

## 2014-06-21 DIAGNOSIS — H33303 Unspecified retinal break, bilateral: Secondary | ICD-10-CM

## 2014-06-25 ENCOUNTER — Encounter (INDEPENDENT_AMBULATORY_CARE_PROVIDER_SITE_OTHER): Payer: BC Managed Care – PPO | Admitting: Ophthalmology

## 2014-06-25 DIAGNOSIS — H33303 Unspecified retinal break, bilateral: Secondary | ICD-10-CM

## 2014-06-25 DIAGNOSIS — H43811 Vitreous degeneration, right eye: Secondary | ICD-10-CM

## 2014-06-25 DIAGNOSIS — H43813 Vitreous degeneration, bilateral: Secondary | ICD-10-CM | POA: Diagnosis not present

## 2014-06-25 NOTE — H&P (Signed)
[name] is an 64 y.o. female.   Chief Complaint:Sudden clouds in the vision right eye HPI: multiple retinal breaks in the past treated with focal laser.  Now has break with vitreous hemorrhage.  Past Medical History  Diagnosis Date  . Hyperlipidemia   . Depression   . History of vertigo   . ETOH abuse   . 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 Arrhythmia     PAF ON FLECAINIDE  . Carotid artery occlusion   . Hx of varicella   . Seasonal allergies   . Kidney stones     remote  . Hx: UTI (urinary tract infection)     Past Surgical History  Procedure Laterality Date  . Melanoma excision  2010    Left Cheek  . Cataract  2015    Both eyes    Family History  Problem Relation Age of Onset  . Heart disease Father   . Alcohol abuse Father   . Bipolar disorder Father   . Dementia Mother   . Arthritis Mother   . Bipolar disorder Sister     father also   . Stroke Maternal Grandfather   . Heart disease Maternal Grandfather   . Lung cancer Paternal Grandmother    Social History:  reports that she has never smoked. She has never used smokeless tobacco. She reports that she drinks alcohol. She reports that she does not use illicit drugs.  Allergies:  Allergies  Allergen Reactions  . Sulfonamide Derivatives     REACTION: rash  . Cephalosporins Rash    No prescriptions prior to admission    Review of systems otherwise negative  There were no vitals taken for this visit.  Physical exam: Mental status: oriented x3. Eyes: See eye exam associated with this date of surgery in media tab.  Scanned in by scanning center Ears, Nose, Throat: within normal limits Neck: Within Normal limits General: within normal limits Chest: Within normal limits Breast: deferred Heart: Within normal limits Abdomen: Within normal limits GU: deferred Extremities: within normal limits Skin: within normal limits  Assessment/Plan Retinal break, vitreous hemorrhage right eye Plan:  To [facility] for Pars plana vitrectomy, laser, gas injection right eye  Hayden Pedro 06/25/2014, 12:25 PM

## 2014-06-28 ENCOUNTER — Telehealth: Payer: Self-pay | Admitting: Internal Medicine

## 2014-06-28 NOTE — Telephone Encounter (Signed)
New Msg          Pt calling to see if fax has been received for surgical clearance?  Please return call at work #.

## 2014-07-03 NOTE — Telephone Encounter (Signed)
Patient scheduled for eye surgery next Tuesday. She stopped her aspirin as of today (was instructed to stop it a week prior) She will re-fax surgical clearance form tomorrow AM.  She is aware that Dr. Harrington Challenger is away this week and I am uncertain about getting clearance completed.  Awaiting surgical clearance form.

## 2014-07-05 NOTE — Telephone Encounter (Signed)
Received fax'd form from patient for clearance to have retina surgery with Dr. Zigmund Daniel, [facility]. Surgery is scheduled for 4/5. [facility], spoke with a technician (only one in office today) and informed that Dr. Harrington Challenger has been unavailable this week and even tho she returns on Monday, that may not be enough time to have clearance signed and/or pt may not be cleared.  Also informed technician patient has already stopped taking her asa as they had instructed her to do a week prior to the surgery.  He will contact Dr. Zigmund Daniel and let him know.  Called patient to update. She sees Dr. Zigmund Daniel Monday at 2pm to find out for certain if she needs the eye surgery.

## 2014-07-09 ENCOUNTER — Encounter (INDEPENDENT_AMBULATORY_CARE_PROVIDER_SITE_OTHER): Payer: BC Managed Care – PPO | Admitting: Ophthalmology

## 2014-07-09 DIAGNOSIS — H33301 Unspecified retinal break, right eye: Secondary | ICD-10-CM

## 2014-07-09 DIAGNOSIS — H43811 Vitreous degeneration, right eye: Secondary | ICD-10-CM

## 2014-07-09 MED ORDER — CYCLOPENTOLATE HCL 1 % OP SOLN: 1.0000 [drp] | OPHTHALMIC | Status: DC | PRN

## 2014-07-09 MED ORDER — PHENYLEPHRINE HCL 2.5 % OP SOLN: 1.0000 [drp] | OPHTHALMIC | Status: DC | PRN

## 2014-07-09 MED ORDER — CLINDAMYCIN PHOSPHATE 600 MG/50ML IV SOLN: 600.0000 mg | INTRAVENOUS | Status: DC | PRN

## 2014-07-09 MED ORDER — GATIFLOXACIN 0.5 % OP SOLN: 1.0000 [drp] | OPHTHALMIC | Status: DC | PRN

## 2014-07-09 MED ORDER — TROPICAMIDE 1 % OP SOLN: 1.0000 [drp] | OPHTHALMIC | Status: DC | PRN

## 2014-07-09 NOTE — Telephone Encounter (Signed)
Signed surgical clearance form placed in nurse fax bin.

## 2014-07-10 ENCOUNTER — Encounter (HOSPITAL_COMMUNITY): Admission: RE | Payer: Self-pay | Source: Ambulatory Visit

## 2014-07-10 ENCOUNTER — Ambulatory Visit (HOSPITAL_COMMUNITY): Admission: RE | Admit: 2014-07-10 | Payer: BC Managed Care – PPO | Source: Ambulatory Visit | Admitting: Ophthalmology

## 2014-07-10 SURGERY — PARS PLANA VITRECTOMY WITH 25 GAUGE
Anesthesia: General | Laterality: Right

## 2014-07-26 ENCOUNTER — Telehealth: Payer: Self-pay | Admitting: Internal Medicine

## 2014-07-26 MED ORDER — SERTRALINE HCL 100 MG PO TABS: 100.0000 mg | ORAL_TABLET | Freq: Every day | ORAL | Status: DC

## 2014-07-26 NOTE — Telephone Encounter (Signed)
Pt needs sertraline 100 mg # 30 w/refills send to [facility]. This med was last prescribed by dr reddy psychiatry

## 2014-07-26 NOTE — Telephone Encounter (Signed)
WP meant to fill while in [place] on 06/05/14 but did not send it electronically.  Was under fill later.  Sent to the pharmacy by e-scribe.

## 2014-07-30 ENCOUNTER — Encounter (HOSPITAL_COMMUNITY): Payer: Self-pay | Admitting: *Deleted

## 2014-07-30 ENCOUNTER — Encounter (INDEPENDENT_AMBULATORY_CARE_PROVIDER_SITE_OTHER): Payer: BC Managed Care – PPO | Admitting: Ophthalmology

## 2014-07-30 DIAGNOSIS — H33001 Unspecified retinal detachment with retinal break, right eye: Secondary | ICD-10-CM | POA: Diagnosis present

## 2014-07-30 DIAGNOSIS — H338 Other retinal detachments: Secondary | ICD-10-CM

## 2014-07-30 NOTE — H&P (Signed)
[name] is an 64 y.o. female.   Chief Complaint: sudden loss of superior and central vision right eye HPI: has had multiple laser treatments for retinal breaks and lattice retinal degeneration.  Was booked for vitrectomy and laser on 07-10-14. She cancelled and did not want surgery.  Last week she lost vision.  Past Medical History  Diagnosis Date  . Hyperlipidemia   . Depression   . History of vertigo   . ETOH abuse   . 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 Arrhythmia     PAF ON FLECAINIDE  . Carotid artery occlusion   . Hx of varicella   . Seasonal allergies   . Kidney stones     remote  . Hx: UTI (urinary tract infection)     Past Surgical History  Procedure Laterality Date  . Melanoma excision  2010    Left Cheek  . Cataract  2015    Both eyes    Family History  Problem Relation Age of Onset  . Heart disease Father   . Alcohol abuse Father   . Bipolar disorder Father   . Dementia Mother   . Arthritis Mother   . Bipolar disorder Sister     father also   . Stroke Maternal Grandfather   . Heart disease Maternal Grandfather   . Lung cancer Paternal Grandmother    Social History:  reports that she has never smoked. She has never used smokeless tobacco. She reports that she drinks alcohol. She reports that she does not use illicit drugs.  Allergies:  Allergies  Allergen Reactions  . Cephalosporins Rash  . Sulfonamide Derivatives Rash    No prescriptions prior to admission    Review of systems otherwise negative  There were no vitals taken for this visit.  Physical exam: Mental status: oriented x3. Eyes: See eye exam associated with this date of surgery in media tab.  Scanned in by scanning center Ears, Nose, Throat: within normal limits Neck: Within Normal limits General: within normal limits Chest: Within normal limits Breast: deferred Heart: Within normal limits Abdomen: Within normal limits GU: deferred Extremities: within normal  limits Skin: within normal limits  Assessment/Plan Rhegmatogenous retinal detachment right eye.  Lattice retinal degeneration. Right eye Plan: To [facility] for Scleral buckle, laser, gas injection.  Right eye  Hayden Pedro 07/30/2014, 5:27 PM

## 2014-07-31 ENCOUNTER — Encounter (HOSPITAL_COMMUNITY)
Admission: RE | Disposition: A | Discharge status: Home / Self Care | Payer: Self-pay | Source: Ambulatory Visit | Attending: Ophthalmology

## 2014-07-31 ENCOUNTER — Encounter (HOSPITAL_COMMUNITY): Payer: Self-pay | Admitting: *Deleted

## 2014-07-31 ENCOUNTER — Ambulatory Visit (HOSPITAL_COMMUNITY): Payer: BC Managed Care – PPO | Admitting: Anesthesiology

## 2014-07-31 ENCOUNTER — Ambulatory Visit (HOSPITAL_COMMUNITY)
Admission: RE | Admit: 2014-07-31 | Discharge: 2014-08-01 | Disposition: A | Discharge status: Home / Self Care | Payer: BC Managed Care – PPO | Source: Ambulatory Visit | Attending: Ophthalmology | Admitting: Ophthalmology

## 2014-07-31 ENCOUNTER — Other Ambulatory Visit: Payer: Self-pay

## 2014-07-31 DIAGNOSIS — Z881 Allergy status to other antibiotic agents status: Secondary | ICD-10-CM | POA: Insufficient documentation

## 2014-07-31 DIAGNOSIS — I480 Paroxysmal atrial fibrillation: Secondary | ICD-10-CM | POA: Insufficient documentation

## 2014-07-31 DIAGNOSIS — H33001 Unspecified retinal detachment with retinal break, right eye: Secondary | ICD-10-CM | POA: Insufficient documentation

## 2014-07-31 DIAGNOSIS — Z9841 Cataract extraction status, right eye: Secondary | ICD-10-CM | POA: Insufficient documentation

## 2014-07-31 DIAGNOSIS — F329 Major depressive disorder, single episode, unspecified: Secondary | ICD-10-CM | POA: Diagnosis not present

## 2014-07-31 DIAGNOSIS — F1099 Alcohol use, unspecified with unspecified alcohol-induced disorder: Secondary | ICD-10-CM | POA: Diagnosis not present

## 2014-07-31 DIAGNOSIS — Z9842 Cataract extraction status, left eye: Secondary | ICD-10-CM | POA: Diagnosis not present

## 2014-07-31 DIAGNOSIS — Z87442 Personal history of urinary calculi: Secondary | ICD-10-CM | POA: Diagnosis not present

## 2014-07-31 DIAGNOSIS — E785 Hyperlipidemia, unspecified: Secondary | ICD-10-CM | POA: Insufficient documentation

## 2014-07-31 DIAGNOSIS — Z882 Allergy status to sulfonamides status: Secondary | ICD-10-CM | POA: Insufficient documentation

## 2014-07-31 DIAGNOSIS — Z87440 Personal history of urinary (tract) infections: Secondary | ICD-10-CM | POA: Diagnosis not present

## 2014-07-31 DIAGNOSIS — Z85820 Personal history of malignant melanoma of skin: Secondary | ICD-10-CM | POA: Diagnosis not present

## 2014-07-31 HISTORY — PX: PHOTOCOAGULATION WITH LASER: SHX6027

## 2014-07-31 HISTORY — DX: Unspecified hemorrhoids: K64.9

## 2014-07-31 HISTORY — PX: SCLERAL BUCKLE: SHX5340

## 2014-07-31 LAB — COMPREHENSIVE METABOLIC PANEL
ALBUMIN: 4.2 g/dL (ref 3.5–5.2)
ALT: 22 U/L (ref 0–35)
AST: 43 U/L — ABNORMAL HIGH (ref 0–37)
Alkaline Phosphatase: 69 U/L (ref 39–117)
Anion gap: 13 (ref 5–15)
BILIRUBIN TOTAL: 1.2 mg/dL (ref 0.3–1.2)
BUN: 11 mg/dL (ref 6–23)
CO2: 21 mmol/L (ref 19–32)
Calcium: 9.2 mg/dL (ref 8.4–10.5)
Chloride: 107 mmol/L (ref 96–112)
Creatinine, Ser: 0.88 mg/dL (ref 0.50–1.10)
GFR calc Af Amer: 79 mL/min — ABNORMAL LOW (ref >90)
GFR, EST NON AFRICAN AMERICAN: 68 mL/min — AB (ref >90)
Glucose, Bld: 128 mg/dL — ABNORMAL HIGH (ref 70–99)
POTASSIUM: 3.8 mmol/L (ref 3.5–5.1)
SODIUM: 141 mmol/L (ref 135–145)
Total Protein: 7.3 g/dL (ref 6.0–8.3)

## 2014-07-31 LAB — CBC
HCT: 41.9 % (ref 36.0–46.0)
Hemoglobin: 14 g/dL (ref 12.0–15.0)
MCH: 32.9 pg (ref 26.0–34.0)
MCHC: 33.4 g/dL (ref 30.0–36.0)
MCV: 98.4 fL (ref 78.0–100.0)
Platelets: 180 10*3/uL (ref 150–400)
RBC: 4.26 MIL/uL (ref 3.87–5.11)
RDW: 12 % (ref 11.5–15.5)
WBC: 8 10*3/uL (ref 4.0–10.5)

## 2014-07-31 SURGERY — SCLERAL BUCKLE
Anesthesia: General | Site: Eye | Laterality: Right

## 2014-07-31 MED ORDER — PROPOFOL 10 MG/ML IV BOLUS
INTRAVENOUS | Status: AC
  Filled 2014-07-31: qty 20

## 2014-07-31 MED ORDER — KETOROLAC TROMETHAMINE 30 MG/ML IJ SOLN
30.0000 mg | Freq: Once | INTRAMUSCULAR | Status: DC | PRN
Start: 2014-07-31 — End: 2014-07-31

## 2014-07-31 MED ORDER — SODIUM CHLORIDE 0.9 % IJ SOLN
INTRAMUSCULAR | Status: DC | PRN
  Administered 2014-07-31: 10 mL

## 2014-07-31 MED ORDER — POLYMYXIN B SULFATE 500000 UNITS IJ SOLR
INTRAMUSCULAR | Status: AC
  Filled 2014-07-31: qty 1

## 2014-07-31 MED ORDER — OXYCODONE HCL 5 MG/5ML PO SOLN: 5.0000 mg | Freq: Once | ORAL | Status: DC | PRN

## 2014-07-31 MED ORDER — LACTATED RINGERS IV SOLN
INTRAVENOUS | Status: DC | PRN
  Administered 2014-07-31: 16:00:00 via INTRAVENOUS

## 2014-07-31 MED ORDER — OXYCODONE HCL 5 MG PO TABS: 5.0000 mg | ORAL_TABLET | Freq: Once | ORAL | Status: DC | PRN

## 2014-07-31 MED ORDER — IBUPROFEN 200 MG PO TABS: 200.0000 mg | ORAL_TABLET | Freq: Four times a day (QID) | ORAL | Status: DC | PRN

## 2014-07-31 MED ORDER — BRIMONIDINE TARTRATE 0.2 % OP SOLN
1.0000 [drp] | Freq: Two times a day (BID) | OPHTHALMIC | Status: DC
  Filled 2014-07-31: qty 5

## 2014-07-31 MED ORDER — MORPHINE SULFATE 2 MG/ML IJ SOLN: 1.0000 mg | INTRAMUSCULAR | Status: DC | PRN

## 2014-07-31 MED ORDER — FENTANYL CITRATE (PF) 250 MCG/5ML IJ SOLN
INTRAMUSCULAR | Status: AC
  Filled 2014-07-31: qty 5

## 2014-07-31 MED ORDER — MAGNESIUM HYDROXIDE 400 MG/5ML PO SUSP: 15.0000 mL | Freq: Four times a day (QID) | ORAL | Status: DC | PRN

## 2014-07-31 MED ORDER — BSS PLUS IO SOLN
INTRAOCULAR | Status: AC
  Filled 2014-07-31: qty 500

## 2014-07-31 MED ORDER — TROPICAMIDE 1 % OP SOLN
OPHTHALMIC | Status: AC
  Administered 2014-07-31: 1 [drp] via OPHTHALMIC
  Filled 2014-07-31: qty 3

## 2014-07-31 MED ORDER — PHENYLEPHRINE HCL 2.5 % OP SOLN
OPHTHALMIC | Status: AC
  Administered 2014-07-31: 1 [drp] via OPHTHALMIC
  Filled 2014-07-31: qty 2

## 2014-07-31 MED ORDER — CYCLOPENTOLATE HCL 1 % OP SOLN
OPHTHALMIC | Status: AC
  Administered 2014-07-31: 1 [drp] via OPHTHALMIC
  Filled 2014-07-31: qty 2

## 2014-07-31 MED ORDER — TEMAZEPAM 15 MG PO CAPS: 15.0000 mg | ORAL_CAPSULE | Freq: Every evening | ORAL | Status: DC | PRN

## 2014-07-31 MED ORDER — ROCURONIUM BROMIDE 50 MG/5ML IV SOLN
INTRAVENOUS | Status: AC
  Filled 2014-07-31: qty 1

## 2014-07-31 MED ORDER — GATIFLOXACIN 0.5 % OP SOLN
1.0000 [drp] | Freq: Four times a day (QID) | OPHTHALMIC | Status: DC
  Filled 2014-07-31: qty 2.5

## 2014-07-31 MED ORDER — DEXAMETHASONE SODIUM PHOSPHATE 10 MG/ML IJ SOLN
INTRAMUSCULAR | Status: AC
  Filled 2014-07-31: qty 1

## 2014-07-31 MED ORDER — PREDNISOLONE ACETATE 1 % OP SUSP
1.0000 [drp] | Freq: Four times a day (QID) | OPHTHALMIC | Status: DC
  Filled 2014-07-31: qty 5

## 2014-07-31 MED ORDER — FENTANYL CITRATE (PF) 100 MCG/2ML IJ SOLN
INTRAMUSCULAR | Status: DC | PRN
  Administered 2014-07-31: 100 ug via INTRAVENOUS
  Administered 2014-07-31: 50 ug via INTRAVENOUS

## 2014-07-31 MED ORDER — MEPERIDINE HCL 25 MG/ML IJ SOLN
6.2500 mg | INTRAMUSCULAR | Status: DC | PRN
Start: 2014-07-31 — End: 2014-07-31

## 2014-07-31 MED ORDER — BUPIVACAINE HCL (PF) 0.75 % IJ SOLN
INTRAMUSCULAR | Status: AC
  Filled 2014-07-31: qty 10

## 2014-07-31 MED ORDER — TROPICAMIDE 1 % OP SOLN: 1.0000 [drp] | OPHTHALMIC | Status: DC | PRN

## 2014-07-31 MED ORDER — CYCLOPENTOLATE HCL 1 % OP SOLN
1.0000 [drp] | OPHTHALMIC | Status: AC | PRN
  Administered 2014-07-31 (×3): 1 [drp] via OPHTHALMIC

## 2014-07-31 MED ORDER — HYDROMORPHONE HCL 1 MG/ML IJ SOLN
INTRAMUSCULAR | Status: AC
  Administered 2014-07-31: 0.5 mg via INTRAVENOUS
  Filled 2014-07-31: qty 1

## 2014-07-31 MED ORDER — GATIFLOXACIN 0.5 % OP SOLN: 1.0000 [drp] | OPHTHALMIC | Status: DC | PRN

## 2014-07-31 MED ORDER — SODIUM CHLORIDE 0.45 % IV SOLN: INTRAVENOUS | Status: DC

## 2014-07-31 MED ORDER — CLINDAMYCIN PHOSPHATE 600 MG/50ML IV SOLN
INTRAVENOUS | Status: AC
  Administered 2014-07-31: 600 mg via INTRAVENOUS
  Filled 2014-07-31: qty 50

## 2014-07-31 MED ORDER — LATANOPROST 0.005 % OP SOLN
1.0000 [drp] | Freq: Every day | OPHTHALMIC | Status: DC
  Filled 2014-07-31: qty 2.5

## 2014-07-31 MED ORDER — TROPICAMIDE 1 % OP SOLN
1.0000 [drp] | OPHTHALMIC | Status: AC | PRN
  Administered 2014-07-31 (×3): 1 [drp] via OPHTHALMIC

## 2014-07-31 MED ORDER — SODIUM CHLORIDE 0.9 % IV SOLN
INTRAVENOUS | Status: DC
  Administered 2014-07-31 (×2): via INTRAVENOUS

## 2014-07-31 MED ORDER — SERTRALINE HCL 100 MG PO TABS
100.0000 mg | ORAL_TABLET | Freq: Every day | ORAL | Status: DC
  Administered 2014-08-01: 100 mg via ORAL
  Filled 2014-07-31 (×2): qty 1

## 2014-07-31 MED ORDER — EPINEPHRINE HCL 1 MG/ML IJ SOLN
INTRAMUSCULAR | Status: AC
  Filled 2014-07-31: qty 1

## 2014-07-31 MED ORDER — HYDROMORPHONE HCL 1 MG/ML IJ SOLN
0.2500 mg | INTRAMUSCULAR | Status: DC | PRN
  Administered 2014-07-31 (×4): 0.5 mg via INTRAVENOUS

## 2014-07-31 MED ORDER — CYCLOPENTOLATE HCL 1 % OP SOLN: 1.0000 [drp] | OPHTHALMIC | Status: DC | PRN

## 2014-07-31 MED ORDER — BACITRACIN-POLYMYXIN B 500-10000 UNIT/GM OP OINT
1.0000 "application " | TOPICAL_OINTMENT | Freq: Three times a day (TID) | OPHTHALMIC | Status: DC
  Filled 2014-07-31: qty 3.5

## 2014-07-31 MED ORDER — BSS IO SOLN
INTRAOCULAR | Status: DC | PRN
  Administered 2014-07-31: 15 mL via INTRAOCULAR

## 2014-07-31 MED ORDER — ATROPINE SULFATE 1 % OP SOLN
OPHTHALMIC | Status: DC | PRN
  Administered 2014-07-31: 1 [drp] via OPHTHALMIC

## 2014-07-31 MED ORDER — PROPOFOL 10 MG/ML IV BOLUS
INTRAVENOUS | Status: DC | PRN
  Administered 2014-07-31: 140 mg via INTRAVENOUS

## 2014-07-31 MED ORDER — SODIUM HYALURONATE 10 MG/ML IO SOLN
INTRAOCULAR | Status: DC | PRN
  Administered 2014-07-31: 0.85 mL via INTRAOCULAR

## 2014-07-31 MED ORDER — DEXAMETHASONE SODIUM PHOSPHATE 10 MG/ML IJ SOLN
INTRAMUSCULAR | Status: DC | PRN
  Administered 2014-07-31: 10 mg

## 2014-07-31 MED ORDER — ONDANSETRON HCL 4 MG/2ML IJ SOLN
INTRAMUSCULAR | Status: DC | PRN
  Administered 2014-07-31: 4 mg via INTRAVENOUS

## 2014-07-31 MED ORDER — DILTIAZEM HCL ER COATED BEADS 120 MG PO CP24
120.0000 mg | ORAL_CAPSULE | Freq: Every day | ORAL | Status: DC
  Filled 2014-07-31: qty 1

## 2014-07-31 MED ORDER — ACETAMINOPHEN 325 MG PO TABS: 325.0000 mg | ORAL_TABLET | ORAL | Status: DC | PRN

## 2014-07-31 MED ORDER — ONDANSETRON HCL 4 MG/2ML IJ SOLN: 4.0000 mg | Freq: Four times a day (QID) | INTRAMUSCULAR | Status: DC | PRN

## 2014-07-31 MED ORDER — HYDROCODONE-ACETAMINOPHEN 5-325 MG PO TABS: 1.0000 | ORAL_TABLET | ORAL | Status: DC | PRN

## 2014-07-31 MED ORDER — FLECAINIDE ACETATE 50 MG PO TABS
50.0000 mg | ORAL_TABLET | Freq: Once | ORAL | Status: AC
  Administered 2014-07-31: 50 mg via ORAL
  Filled 2014-07-31: qty 1

## 2014-07-31 MED ORDER — NEOSTIGMINE METHYLSULFATE 10 MG/10ML IV SOLN
INTRAVENOUS | Status: DC | PRN
  Administered 2014-07-31: 3 mg via INTRAVENOUS

## 2014-07-31 MED ORDER — BACITRACIN-POLYMYXIN B 500-10000 UNIT/GM OP OINT
TOPICAL_OINTMENT | OPHTHALMIC | Status: AC
  Filled 2014-07-31: qty 3.5

## 2014-07-31 MED ORDER — GLYCOPYRROLATE 0.2 MG/ML IJ SOLN
INTRAMUSCULAR | Status: DC | PRN
  Administered 2014-07-31: .5 mg via INTRAVENOUS

## 2014-07-31 MED ORDER — CLINDAMYCIN PHOSPHATE 600 MG/50ML IV SOLN: 600.0000 mg | INTRAVENOUS | Status: DC | PRN

## 2014-07-31 MED ORDER — ATROPINE SULFATE 1 % OP SOLN
OPHTHALMIC | Status: AC
  Filled 2014-07-31: qty 5

## 2014-07-31 MED ORDER — GATIFLOXACIN 0.5 % OP SOLN
OPHTHALMIC | Status: AC
  Administered 2014-07-31: 1 [drp] via OPHTHALMIC
  Filled 2014-07-31: qty 2.5

## 2014-07-31 MED ORDER — EPHEDRINE SULFATE 50 MG/ML IJ SOLN
INTRAMUSCULAR | Status: DC | PRN
  Administered 2014-07-31 (×2): 10 mg via INTRAVENOUS

## 2014-07-31 MED ORDER — TETRACAINE HCL 0.5 % OP SOLN
2.0000 [drp] | Freq: Once | OPHTHALMIC | Status: DC
  Filled 2014-07-31: qty 2

## 2014-07-31 MED ORDER — ONDANSETRON HCL 4 MG/2ML IJ SOLN
INTRAMUSCULAR | Status: AC
  Filled 2014-07-31: qty 2

## 2014-07-31 MED ORDER — MIDAZOLAM HCL 5 MG/5ML IJ SOLN
INTRAMUSCULAR | Status: DC | PRN
  Administered 2014-07-31: 2 mg via INTRAVENOUS

## 2014-07-31 MED ORDER — ROCURONIUM BROMIDE 100 MG/10ML IV SOLN
INTRAVENOUS | Status: DC | PRN
  Administered 2014-07-31: 40 mg via INTRAVENOUS

## 2014-07-31 MED ORDER — SODIUM HYALURONATE 10 MG/ML IO SOLN
INTRAOCULAR | Status: AC
  Filled 2014-07-31: qty 0.85

## 2014-07-31 MED ORDER — GENTAMICIN SULFATE 40 MG/ML IJ SOLN
INTRAMUSCULAR | Status: AC
  Filled 2014-07-31: qty 2

## 2014-07-31 MED ORDER — BACITRACIN-POLYMYXIN B 500-10000 UNIT/GM OP OINT
TOPICAL_OINTMENT | OPHTHALMIC | Status: DC | PRN
  Administered 2014-07-31: 1 via OPHTHALMIC

## 2014-07-31 MED ORDER — MIDAZOLAM HCL 2 MG/2ML IJ SOLN
INTRAMUSCULAR | Status: AC
  Filled 2014-07-31: qty 2

## 2014-07-31 MED ORDER — GATIFLOXACIN 0.5 % OP SOLN
1.0000 [drp] | OPHTHALMIC | Status: AC | PRN
  Administered 2014-07-31 (×3): 1 [drp] via OPHTHALMIC

## 2014-07-31 MED ORDER — PHENYLEPHRINE HCL 2.5 % OP SOLN
1.0000 [drp] | OPHTHALMIC | Status: AC | PRN
  Administered 2014-07-31 (×3): 1 [drp] via OPHTHALMIC

## 2014-07-31 MED ORDER — TRIAMCINOLONE ACETONIDE 40 MG/ML IJ SUSP
INTRAMUSCULAR | Status: AC
  Filled 2014-07-31: qty 5

## 2014-07-31 MED ORDER — IBUPROFEN 100 MG/5ML PO SUSP: 200.0000 mg | Freq: Four times a day (QID) | ORAL | Status: DC | PRN

## 2014-07-31 MED ORDER — BSS IO SOLN
INTRAOCULAR | Status: AC
  Filled 2014-07-31: qty 15

## 2014-07-31 MED ORDER — SUCCINYLCHOLINE CHLORIDE 20 MG/ML IJ SOLN
INTRAMUSCULAR | Status: AC
  Filled 2014-07-31: qty 1

## 2014-07-31 MED ORDER — LIDOCAINE HCL (CARDIAC) 20 MG/ML IV SOLN
INTRAVENOUS | Status: DC | PRN
  Administered 2014-07-31: 70 mg via INTRAVENOUS

## 2014-07-31 MED ORDER — LIDOCAINE HCL (CARDIAC) 20 MG/ML IV SOLN
INTRAVENOUS | Status: AC
  Filled 2014-07-31: qty 5

## 2014-07-31 MED ORDER — ACETAZOLAMIDE SODIUM 500 MG IJ SOLR
500.0000 mg | Freq: Once | INTRAMUSCULAR | Status: AC
  Administered 2014-08-01: 500 mg via INTRAVENOUS
  Filled 2014-07-31: qty 500

## 2014-07-31 MED ORDER — BUPIVACAINE HCL (PF) 0.75 % IJ SOLN
INTRAMUSCULAR | Status: DC | PRN
  Administered 2014-07-31: 10 mL

## 2014-07-31 MED ORDER — PHENYLEPHRINE HCL 2.5 % OP SOLN: 1.0000 [drp] | OPHTHALMIC | Status: DC | PRN

## 2014-07-31 SURGICAL SUPPLY — 70 items
APPLICATOR DR MATTHEWS STRL (MISCELLANEOUS) ×18 IMPLANT
BLADE EYE CATARACT 19 1.4 BEAV (BLADE) IMPLANT
BLADE MVR KNIFE 19G (BLADE) IMPLANT
CANNULA ANT CHAM MAIN (OPHTHALMIC RELATED) IMPLANT
CANNULA DUAL BORE 23G (CANNULA) IMPLANT
CORDS BIPOLAR (ELECTRODE) IMPLANT
COTTONBALL LRG STERILE PKG (GAUZE/BANDAGES/DRESSINGS) ×9 IMPLANT
COVER MAYO STAND STRL (DRAPES) IMPLANT
COVER SURGICAL LIGHT HANDLE (MISCELLANEOUS) ×3 IMPLANT
DRAPE INCISE 51X51 W/FILM STRL (DRAPES) IMPLANT
DRAPE OPHTHALMIC 77X100 STRL (CUSTOM PROCEDURE TRAY) ×3 IMPLANT
ERASER HMR WETFIELD 23G BP (MISCELLANEOUS) IMPLANT
FILTER BLUE MILLIPORE (MISCELLANEOUS) ×6 IMPLANT
FILTER STRAW FLUID ASPIR (MISCELLANEOUS) IMPLANT
FORCEPS GRIESHABER ILM 25G A (INSTRUMENTS) IMPLANT
GAS AUTO FILL CONSTEL (OPHTHALMIC)
GAS AUTO FILL CONSTELLATION (OPHTHALMIC) IMPLANT
GLOVE SS BIOGEL STRL SZ 6.5 (GLOVE) ×1 IMPLANT
GLOVE SS BIOGEL STRL SZ 7 (GLOVE) ×1 IMPLANT
GLOVE SUPERSENSE BIOGEL SZ 6.5 (GLOVE) ×2
GLOVE SUPERSENSE BIOGEL SZ 7 (GLOVE) ×2
GLOVE SURG 8.5 LATEX PF (GLOVE) ×3 IMPLANT
GLOVE SURG SS PI 6.5 STRL IVOR (GLOVE) ×3 IMPLANT
GOWN STRL REUS W/ TWL LRG LVL3 (GOWN DISPOSABLE) ×3 IMPLANT
GOWN STRL REUS W/TWL LRG LVL3 (GOWN DISPOSABLE) ×6
HANDLE PNEUMATIC FOR CONSTEL (OPHTHALMIC) IMPLANT
IMPL SILICONE (Ophthalmic Related) ×1 IMPLANT
IMPLANT SILICONE (Ophthalmic Related) ×5 IMPLANT
KIT BASIN OR (CUSTOM PROCEDURE TRAY) ×3 IMPLANT
KIT PERFLUORON PROCEDURE 5ML (MISCELLANEOUS) IMPLANT
KIT ROOM TURNOVER OR (KITS) ×3 IMPLANT
KNIFE CRESCENT 1.75 EDGEAHEAD (BLADE) IMPLANT
KNIFE GRIESHABER SHARP 2.5MM (MISCELLANEOUS) ×9 IMPLANT
MICROPICK 25G (MISCELLANEOUS)
NEEDLE 18GX1X1/2 (RX/OR ONLY) (NEEDLE) ×3 IMPLANT
NEEDLE 25GX 5/8IN NON SAFETY (NEEDLE) IMPLANT
NEEDLE 27GAX1X1/2 (NEEDLE) IMPLANT
NEEDLE HYPO 30X.5 LL (NEEDLE) IMPLANT
NS IRRIG 1000ML POUR BTL (IV SOLUTION) ×3 IMPLANT
PACK VITRECTOMY CUSTOM (CUSTOM PROCEDURE TRAY) ×3 IMPLANT
PAD ARMBOARD 7.5X6 YLW CONV (MISCELLANEOUS) ×6 IMPLANT
PAK PIK VITRECTOMY CVS 25GA (OPHTHALMIC) IMPLANT
PIC ILLUMINATED 25G (OPHTHALMIC)
PICK MICROPICK 25G (MISCELLANEOUS) IMPLANT
PIK ILLUMINATED 25G (OPHTHALMIC) IMPLANT
PROBE LASER ILLUM FLEX CVD 25G (OPHTHALMIC) IMPLANT
REPL STRA BRUSH NEEDLE (NEEDLE) IMPLANT
RESERVOIR BACK FLUSH (MISCELLANEOUS) IMPLANT
ROLLS DENTAL (MISCELLANEOUS) ×6 IMPLANT
SLEEVE SCLERAL TYPE 270 (Ophthalmic Related) ×3 IMPLANT
SPEAR EYE SURG WECK-CEL (MISCELLANEOUS) ×12 IMPLANT
SPONGE SURGIFOAM ABS GEL 12-7 (HEMOSTASIS) ×3 IMPLANT
STOPCOCK 4 WAY LG BORE MALE ST (IV SETS) IMPLANT
SUT CHROMIC 7 0 TG140 8 (SUTURE) ×3 IMPLANT
SUT ETHILON 9 0 TG140 8 (SUTURE) IMPLANT
SUT MERSILENE 4 0 RV 2 (SUTURE) ×6 IMPLANT
SUT SILK 2 0 (SUTURE) ×2
SUT SILK 2-0 18XBRD TIE 12 (SUTURE) ×1 IMPLANT
SUT SILK 4 0 RB 1 (SUTURE) ×3 IMPLANT
SYR 20CC LL (SYRINGE) ×3 IMPLANT
SYR 50ML LL SCALE MARK (SYRINGE) IMPLANT
SYR 5ML LL (SYRINGE) ×3 IMPLANT
SYR BULB 3OZ (MISCELLANEOUS) ×3 IMPLANT
SYR TB 1ML LUER SLIP (SYRINGE) IMPLANT
SYRINGE 10CC LL (SYRINGE) ×3 IMPLANT
TIRE RTNL 2.5XGRV CNCV 9X (Ophthalmic Related) ×1 IMPLANT
TOWEL OR 17X24 6PK STRL BLUE (TOWEL DISPOSABLE) ×3 IMPLANT
TUBING ART PRESS 12 MALE/MALE (MISCELLANEOUS) IMPLANT
WATER STERILE IRR 1000ML POUR (IV SOLUTION) ×3 IMPLANT
WIPE INSTRUMENT VISIWIPE 73X73 (MISCELLANEOUS) IMPLANT

## 2014-07-31 NOTE — H&P (Signed)
I examined the patient today and there is no change in the medical status 

## 2014-07-31 NOTE — Anesthesia Postprocedure Evaluation (Signed)
  Anesthesia Post-op Note  Patient: [name]  Procedure(s) Performed: Procedure(s): SCLERAL BUCKLE RIGHT EYE (Right) PHOTOCOAGULATION WITH LASER (Right)  Patient Location: PACU  Anesthesia Type: General   Level of Consciousness: awake, alert  and oriented  Airway and Oxygen Therapy: Patient Spontanous Breathing  Post-op Pain: mild  Post-op Assessment: Post-op Vital signs reviewed  Post-op Vital Signs: Reviewed  Last Vitals:  Filed Vitals:   07/31/14 1815  BP: 108/44  Pulse: 89  Temp: 37.8 C  Resp: 8    Complications: No apparent anesthesia complications

## 2014-07-31 NOTE — Anesthesia Preprocedure Evaluation (Addendum)
Anesthesia Evaluation  Patient identified by MRN, date of birth, ID band Patient awake    Reviewed: Allergy & Precautions, NPO status , Patient's Chart, lab work & pertinent test results  Airway Mallampati: I  TM Distance: >3 FB Neck ROM: Full    Dental  (+) Teeth Intact   Pulmonary neg pulmonary ROS,  breath sounds clear to auscultation        Cardiovascular + Peripheral Vascular Disease + dysrhythmias Rhythm:Regular Rate:Normal     Neuro/Psych negative neurological ROS     GI/Hepatic (+)     substance abuse  alcohol use,   Endo/Other  negative endocrine ROS  Renal/GU negative Renal ROS     Musculoskeletal   Abdominal   Peds  Hematology negative hematology ROS (+)   Anesthesia Other Findings   Reproductive/Obstetrics                           Anesthesia Physical Anesthesia Plan  ASA: II  Anesthesia Plan: General   Post-op Pain Management:    Induction: Intravenous  Airway Management Planned: Oral ETT  Additional Equipment:   Intra-op Plan:   Post-operative Plan: Extubation in OR  Informed Consent: I have reviewed the patients History and Physical, chart, labs and discussed the procedure including the risks, benefits and alternatives for the proposed anesthesia with the patient or authorized representative who has indicated his/her understanding and acceptance.   Dental advisory given  Plan Discussed with: CRNA and Surgeon  Anesthesia Plan Comments:         Anesthesia Quick Evaluation

## 2014-07-31 NOTE — Transfer of Care (Signed)
Immediate Anesthesia Transfer of Care Note  Patient: [name]  Procedure(s) Performed: Procedure(s): SCLERAL BUCKLE RIGHT EYE (Right) PHOTOCOAGULATION WITH LASER (Right)  Patient Location: PACU  Anesthesia Type:General  Level of Consciousness: awake, alert  and oriented  Airway & Oxygen Therapy: Patient Spontanous Breathing and Patient connected to nasal cannula oxygen  Post-op Assessment: Report given to RN and Post -op Vital signs reviewed and stable  Post vital signs: Reviewed and stable  Last Vitals:  Filed Vitals:   07/31/14 0920  BP: 131/82  Pulse: 87  Temp: 36.9 C  Resp: 18    Complications: No apparent anesthesia complications

## 2014-07-31 NOTE — Progress Notes (Signed)
Home medications of diltiazem and flecainide sent to pharmacy

## 2014-07-31 NOTE — Brief Op Note (Signed)
Brief Operative note   Preoperative diagnosis:  RETINAL DETACHMENT RIGHT EYE Postoperative diagnosis  * No Diagnosis Codes entered *  Procedures: Scleral buckle, laser, gas injection right eye  Surgeon:  Hayden Pedro, MD...  Assistant:  Deatra Ina SA    Anesthesia: General  Specimen: none  Estimated blood loss:  1cc  Complications: none  Patient sent to PACU in good condition  Composed by Hayden Pedro MD  Dictation number: [phone]

## 2014-07-31 NOTE — Anesthesia Procedure Notes (Signed)
Procedure Name: Intubation Performed by: Gean Maidens Pre-anesthesia Checklist: Patient identified, Emergency Drugs available, Suction available, Patient being monitored and Timeout performed Patient Re-evaluated:Patient Re-evaluated prior to inductionOxygen Delivery Method: Circle system utilized Preoxygenation: Pre-oxygenation with 100% oxygen Intubation Type: IV induction Ventilation: Mask ventilation without difficulty Laryngoscope Size: Mac and 3 Grade View: Grade I Tube type: Oral Tube size: 7.0 mm Number of attempts: 1 Placement Confirmation: ETT inserted through vocal cords under direct vision,  breath sounds checked- equal and bilateral,  positive ETCO2 and CO2 detector Secured at: 21 cm Tube secured with: Tape Dental Injury: Teeth and Oropharynx as per pre-operative assessment

## 2014-08-01 ENCOUNTER — Telehealth: Payer: Self-pay

## 2014-08-01 ENCOUNTER — Encounter (HOSPITAL_COMMUNITY): Payer: Self-pay | Admitting: Ophthalmology

## 2014-08-01 DIAGNOSIS — H33001 Unspecified retinal detachment with retinal break, right eye: Secondary | ICD-10-CM | POA: Diagnosis not present

## 2014-08-01 MED ORDER — BACITRACIN-POLYMYXIN B 500-10000 UNIT/GM OP OINT: 1.0000 "application " | TOPICAL_OINTMENT | Freq: Three times a day (TID) | OPHTHALMIC | Status: DC

## 2014-08-01 MED ORDER — PREDNISOLONE ACETATE 1 % OP SUSP: 1.0000 [drp] | Freq: Four times a day (QID) | OPHTHALMIC | Status: DC

## 2014-08-01 MED ORDER — GATIFLOXACIN 0.5 % OP SOLN: 1.0000 [drp] | Freq: Four times a day (QID) | OPHTHALMIC | Status: DC

## 2014-08-01 MED ORDER — SERTRALINE HCL 100 MG PO TABS: 100.0000 mg | ORAL_TABLET | Freq: Every day | ORAL | Status: DC

## 2014-08-01 NOTE — Discharge Summary (Signed)
Discharge summary not needed on [facility] patients per medical records. 

## 2014-08-01 NOTE — Progress Notes (Signed)
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  Discharge to home.  Give post operative instructions  [name] 08/01/2014, 6:42 AM

## 2014-08-01 NOTE — Telephone Encounter (Signed)
[place] refill request for sertraline (ZOLOFT) tablet 100 mg

## 2014-08-01 NOTE — Op Note (Signed)
[name], THAKUR NO.:  [id number]  MEDICAL RECORD NO.:  [id number]  LOCATION:  [id number]                        FACILITY:  [facility]  PHYSICIAN:  Chrystie Nose. Zigmund Daniel, M.D. DATE OF BIRTH:  June 26, 1950  DATE OF PROCEDURE:  07/31/2014 DATE OF DISCHARGE:                              OPERATIVE REPORT   ADMISSION DIAGNOSIS:  Rhegmatogenous retinal detachment, right eye.  PROCEDURES:  Scleral buckle, right eye.  Gas injection, right eye. Retinal photocoagulation, right eye.  SURGEON:  Chrystie Nose. Zigmund Daniel, M.D.  ASSISTANT:  Deatra Ina, SA.  ANESTHESIA:  General.  DETAILS:  Usual prep and drape, 360 degrees limbal peritomy, isolation of 4 rectus muscles on 2-0 silk, scleral dissection for 360 degrees after the method of Schepens.  Diathermy was placed in the bed.  The 279 implant was brought onto the field and 1 mm trim from the posterior edge.  The 279 implant was placed around the globe beneath the muscles in the preplaced scleral sutures, two sutures per quadrant, 4-0 Mersilene were preplaced in the scleral flaps.  Perforation site chosen at 7 o'clock in the posterior aspect of the bed.  A large amount of clear colorless subretinal fluid came forth in a controlled manner. Sterile room air was injected to the 10 o'clock pars plana to reinflate the globe.  The scleral sutures were drawn securely as fluid egressed. The buckle was adjusted and trimmed.  The band was adjusted and trimmed. Indirect ophthalmoscopy showed the retina to be lying nicely on the scleral buckle with no subretinal fluid remaining.  The indirect ophthalmoscope laser was moved into place, 1127 burns were placed around the retinal periphery.  The power of 600 mW, 1000 microns each and 0.1 seconds each.  The scleral sutures were drawn securely, knotted and the free ends removed.  The buckle and band were trimmed.  The conjunctiva was reposited with 7-0 chromic suture.  Polymyxin and gentamicin  were irrigated into tenon space.  Decadron 10 mg was injected into the lower subconjunctival space.  Atropine solution was applied.  Marcaine was injected around the globe for postop pain.  Polysporin ophthalmic ointment, a patch and shield were placed.  The patient was awakened and taken to recovery in satisfactory condition.  Closing pressure was 10 with a [place]quer tonometer.  Complications, none.  Operative time 2 hours.     Chrystie Nose. Zigmund Daniel, M.D.     JDM/MEDQ  D:  07/31/2014  T:  08/01/2014  Job:  [id number]

## 2014-08-01 NOTE — Telephone Encounter (Signed)
Sent to the pharmacy by e-scribe. 

## 2014-08-06 ENCOUNTER — Ambulatory Visit: Payer: BC Managed Care – PPO | Admitting: Internal Medicine

## 2014-08-07 ENCOUNTER — Ambulatory Visit (INDEPENDENT_AMBULATORY_CARE_PROVIDER_SITE_OTHER): Payer: BC Managed Care – PPO | Admitting: Ophthalmology

## 2014-08-07 DIAGNOSIS — H338 Other retinal detachments: Secondary | ICD-10-CM

## 2014-08-09 ENCOUNTER — Encounter: Payer: Self-pay | Admitting: Internal Medicine

## 2014-08-09 ENCOUNTER — Ambulatory Visit (INDEPENDENT_AMBULATORY_CARE_PROVIDER_SITE_OTHER): Payer: BC Managed Care – PPO | Admitting: Internal Medicine

## 2014-08-09 ENCOUNTER — Other Ambulatory Visit: Payer: Self-pay | Admitting: Internal Medicine

## 2014-08-09 VITALS — BP 134/84 | Temp 98.2°F | Ht 63.0 in | Wt 157.8 lb

## 2014-08-09 DIAGNOSIS — F329 Major depressive disorder, single episode, unspecified: Secondary | ICD-10-CM

## 2014-08-09 DIAGNOSIS — F32A Depression, unspecified: Secondary | ICD-10-CM

## 2014-08-09 MED ORDER — SERTRALINE HCL 100 MG PO TABS: 150.0000 mg | ORAL_TABLET | Freq: Every day | ORAL | Status: DC

## 2014-08-09 NOTE — Patient Instructions (Signed)
Increase the sertaline to 150 mg per day  Continue counseling. return office visit in 6-8 weeks or so  Or as needed . There are other options   Also

## 2014-08-09 NOTE — Progress Notes (Signed)
Pre visit review using our clinic review tool, if applicable. No additional management support is needed unless otherwise documented below in the visit note.   Chief Complaint  Patient presents with  . Follow-up    HPI: [name] 64 y.o.    Here for fu of  Depression sx   Just  had eye surgery  Emergently  And ok   Doing ok now  Lonely feeling   Sister died . ove 1 year . Seeing counselor  Every 2 weeks next  5 18  hasnt noted much improvement on 100 sertaline  No se though. ROS: See pertinent positives and negatives per HPI.  Past Medical History  Diagnosis Date  . Hyperlipidemia   . Depression   . History of vertigo   . ETOH abuse   . 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 Arrhythmia     PAF ON FLECAINIDE  . Hx of varicella   . Seasonal allergies   . Hx: UTI (urinary tract infection)   . Carotid artery occlusion     Pt unaware  . History of kidney stones   . Hemorrhoids   . Cancer     Melanoma- left cheek    Family History  Problem Relation Age of Onset  . Heart disease Father   . Alcohol abuse Father   . Bipolar disorder Father   . Dementia Mother   . Arthritis Mother   . Bipolar disorder Sister     father also   . Stroke Maternal Grandfather   . Heart disease Maternal Grandfather   . Lung cancer Paternal Grandmother     History   Social History  . Marital Status: Single    Spouse Name: N/A  . Number of Children: N/A  . Years of Education: N/A   Social History Main Topics  . Smoking status: Never Smoker   . Smokeless tobacco: Never Used  . Alcohol Use: 12.0 oz/week    0 Standard drinks or equivalent, 20 Cans of beer per week  . Drug Use: No  . Sexual Activity: No   Other Topics Concern  . 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    Drinks 6-8 beers on most days.  Believes she has an alcohol abuse problem.    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     Outpatient Prescriptions Prior to Visit  Medication Sig Dispense Refill  . aspirin 325 MG tablet Take 325 mg by mouth daily.      . bacitracin-polymyxin b (POLYSPORIN) ophthalmic ointment Place 1 application into the right eye 3 (three) times daily. apply to eye every 8 hours while awake 3.5 g 0  . CRESTOR 20 MG tablet TAKE (1/2) TABLET DAILY. 15 tablet 5  . diltiazem (CARDIZEM CD) 240 MG 24 hr capsule TAKE (1) CAPSULE DAILY. 30 capsule 11  . flecainide (TAMBOCOR) 50 MG tablet TAKE (2) TABLETS TWICE DAILY. 120 tablet 5  . gatifloxacin (ZYMAXID) 0.5 % SOLN Place 1 drop into the right eye 4 (four) times daily.    Marland Kitchen ibuprofen (ADVIL,MOTRIN) 200 MG tablet Take 200 mg by mouth every 6 (six) hours as needed.    . prednisoLONE acetate (PRED FORTE) 1 % ophthalmic suspension Place 1 drop  into the right eye 4 (four) times daily. 5 mL 0  . sertraline (ZOLOFT) 100 MG tablet Take 1 tablet (100 mg total) by mouth daily. 90 tablet 2   No facility-administered medications prior to visit.     EXAM:  BP 134/84 mmHg  Temp(Src) 98.2 F (36.8 C) (Oral)  Ht 5\' 3"  (1.6 m)  Wt 157 lb 12.8 oz (71.578 kg)  BMI 27.96 kg/m2  Body mass index is 27.96 kg/(m^2).  GENERAL: vitals reviewed and listed above, alert, oriented, appears well hydrated and in no acute distress HEENT: atraumatic, conjunctiva    Right red  no obvious abnormalities on inspection of external nose and ears PSYCH: pleasant and cooperative,  Subdued  nl speech and thought pleasant  phq9 2  :1,2,4,6, 1:357      0; 8,9 ASSESSMENT AND PLAN:  Discussed the following assessment and plan:  Depression - many external factors cont counseling inc sert to 150 and rov 6-8 weeks phq9 11 somewhat difficult  -Patient advised to return or notify health care team  if symptoms worsen ,persist or new concerns arise.  Patient Instructions  Increase the sertaline  to 150 mg per day  Continue counseling. return office visit in 6-8 weeks or so  Or as needed . There are other options   Also    Mariann Laster K. Panosh M.D.

## 2014-08-09 NOTE — Addendum Note (Signed)
Addendum  created 08/09/14 1628 by Laurie Panda, MD   Modules edited: Anesthesia Events, Narrator   Narrator:  Narrator: Event Log Edited

## 2014-08-28 ENCOUNTER — Encounter (INDEPENDENT_AMBULATORY_CARE_PROVIDER_SITE_OTHER): Payer: BC Managed Care – PPO | Admitting: Ophthalmology

## 2014-08-28 DIAGNOSIS — H338 Other retinal detachments: Secondary | ICD-10-CM

## 2014-09-05 ENCOUNTER — Other Ambulatory Visit: Payer: Self-pay | Admitting: Internal Medicine

## 2014-09-27 ENCOUNTER — Encounter: Payer: Self-pay | Admitting: Internal Medicine

## 2014-09-27 ENCOUNTER — Ambulatory Visit (INDEPENDENT_AMBULATORY_CARE_PROVIDER_SITE_OTHER): Payer: BC Managed Care – PPO | Admitting: Internal Medicine

## 2014-09-27 VITALS — BP 116/72 | Temp 98.4°F | Ht 60.75 in | Wt 156.0 lb

## 2014-09-27 DIAGNOSIS — F329 Major depressive disorder, single episode, unspecified: Secondary | ICD-10-CM | POA: Diagnosis not present

## 2014-09-27 DIAGNOSIS — F32A Depression, unspecified: Secondary | ICD-10-CM

## 2014-09-27 DIAGNOSIS — Z79899 Other long term (current) drug therapy: Secondary | ICD-10-CM

## 2014-09-27 MED ORDER — SERTRALINE HCL 100 MG PO TABS: 200.0000 mg | ORAL_TABLET | Freq: Every day | ORAL | Status: DC

## 2014-09-27 NOTE — Patient Instructions (Signed)
Try increase to 200 mg per day Continue exercise .    As you are doing and supportive care.  ROV in 2 months

## 2014-09-27 NOTE — Progress Notes (Signed)
Pre visit review using our clinic review tool, if applicable. No additional management support is needed unless otherwise documented below in the visit note.  Chief Complaint  Patient presents with  . Follow-up    HPI: [name] 64 y.o. comes in fro fu of medication rx for depression has beenon 100 mg zoloft since last visit and some mild improvement less social isolation  walign with others  Going out some getting more stuff done still has deression Seeing psychologist still weekly  Has social support "good friends" denies sig se of med ROS: See pertinent positives and negatives per HPI.  Past Medical History  Diagnosis Date  . Hyperlipidemia   . Depression   . History of vertigo   . ETOH abuse   . 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 Arrhythmia     PAF ON FLECAINIDE  . Hx of varicella   . Seasonal allergies   . Hx: UTI (urinary tract infection)   . Carotid artery occlusion     Pt unaware  . History of kidney stones   . Hemorrhoids   . Cancer     Melanoma- left cheek    Family History  Problem Relation Age of Onset  . Heart disease Father   . Alcohol abuse Father   . Bipolar disorder Father   . Dementia Mother   . Arthritis Mother   . Bipolar disorder Sister     father also   . Stroke Maternal Grandfather   . Heart disease Maternal Grandfather   . Lung cancer Paternal Grandmother     History   Social History  . Marital Status: Single    Spouse Name: N/A  . Number of Children: N/A  . Years of Education: N/A   Social History Main Topics  . Smoking status: Never Smoker   . Smokeless tobacco: Never Used  . Alcohol Use: 12.0 oz/week    0 Standard drinks or equivalent, 20 Cans of beer per week  . Drug Use: No  . Sexual Activity: No   Other Topics Concern  . 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    Drinks 6-8  beers on most days.  Believes she has an alcohol abuse problem.    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     Outpatient Prescriptions Prior to Visit  Medication Sig Dispense Refill  . aspirin 325 MG tablet Take 325 mg by mouth daily.      . CRESTOR 20 MG tablet TAKE (1/2) TABLET DAILY. 15 tablet 3  . diltiazem (CARDIZEM CD) 240 MG 24 hr capsule TAKE (1) CAPSULE DAILY. 30 capsule 11  . flecainide (TAMBOCOR) 50 MG tablet TAKE (2) TABLETS TWICE DAILY. 120 tablet 5  . ibuprofen (ADVIL,MOTRIN) 200 MG tablet Take 200 mg by mouth every 6 (six) hours as needed.    . sertraline (ZOLOFT) 100 MG tablet Take 1.5 tablets (150 mg total) by mouth daily. 135 tablet 2  . bacitracin-polymyxin b (POLYSPORIN) ophthalmic ointment Place 1 application into the right eye 3 (three) times daily. apply to eye every 8 hours while awake (Patient not taking: Reported on 09/27/2014) 3.5 g 0  . gatifloxacin (ZYMAXID) 0.5 % SOLN Place 1 drop into the right eye 4 (  four) times daily.    . prednisoLONE acetate (PRED FORTE) 1 % ophthalmic suspension Place 1 drop into the right eye 4 (four) times daily. 5 mL 0   No facility-administered medications prior to visit.     EXAM:  BP 116/72 mmHg  Temp(Src) 98.4 F (36.9 C) (Oral)  Ht 5' 0.75" (1.543 m)  Wt 156 lb (70.761 kg)  BMI 29.72 kg/m2  Body mass index is 29.72 kg/(m^2).  GENERAL: vitals reviewed and listed above, alert, oriented, appears well hydrated and in no acute distress HEENT: atraumatic, conjunctiva  clear, no obvious abnormalities on inspection of external nose and ears PSYCH: pleasant and cooperative,  Slightly brighter affect  phq9 total    9 1 1,2,4,6,7,9, not suicidal   Only 3 sleep interruption somewhat difficult  ASSESSMENT AND PLAN:  Discussed the following assessment and plan:  Depression - improvment but could b e better  inc to 200 mg trial phq9 9 score sonewhat see text   -Patient advised to  return or notify health care team  if symptoms worsen ,persist or new concerns arise.  Patient Instructions  Try increase to 200 mg per day Continue exercise .    As you are doing and supportive care.  ROV in 2 months      Standley Brooking. Chavonne Sforza M.D.

## 2014-10-10 ENCOUNTER — Other Ambulatory Visit: Payer: Self-pay | Admitting: Internal Medicine

## 2014-11-14 ENCOUNTER — Ambulatory Visit (INDEPENDENT_AMBULATORY_CARE_PROVIDER_SITE_OTHER): Payer: BC Managed Care – PPO | Admitting: Ophthalmology

## 2014-11-19 ENCOUNTER — Encounter (INDEPENDENT_AMBULATORY_CARE_PROVIDER_SITE_OTHER): Payer: BC Managed Care – PPO | Admitting: Ophthalmology

## 2014-11-19 DIAGNOSIS — H33303 Unspecified retinal break, bilateral: Secondary | ICD-10-CM | POA: Diagnosis not present

## 2014-11-19 DIAGNOSIS — H338 Other retinal detachments: Secondary | ICD-10-CM

## 2014-11-19 DIAGNOSIS — H43813 Vitreous degeneration, bilateral: Secondary | ICD-10-CM | POA: Diagnosis not present

## 2014-11-27 ENCOUNTER — Ambulatory Visit (INDEPENDENT_AMBULATORY_CARE_PROVIDER_SITE_OTHER): Payer: BC Managed Care – PPO | Admitting: Internal Medicine

## 2014-11-27 ENCOUNTER — Encounter: Payer: Self-pay | Admitting: Internal Medicine

## 2014-11-27 VITALS — BP 130/72 | Temp 99.1°F | Wt 152.5 lb

## 2014-11-27 DIAGNOSIS — Z789 Other specified health status: Secondary | ICD-10-CM | POA: Diagnosis not present

## 2014-11-27 DIAGNOSIS — F329 Major depressive disorder, single episode, unspecified: Secondary | ICD-10-CM

## 2014-11-27 DIAGNOSIS — F32A Depression, unspecified: Secondary | ICD-10-CM

## 2014-11-27 DIAGNOSIS — Z79899 Other long term (current) drug therapy: Secondary | ICD-10-CM | POA: Diagnosis not present

## 2014-11-27 DIAGNOSIS — Z8679 Personal history of other diseases of the circulatory system: Secondary | ICD-10-CM | POA: Diagnosis not present

## 2014-11-27 DIAGNOSIS — F1090 Alcohol use, unspecified, uncomplicated: Secondary | ICD-10-CM

## 2014-11-27 MED ORDER — BUPROPION HCL ER (XL) 150 MG PO TB24: 150.0000 mg | ORAL_TABLET | Freq: Every day | ORAL | Status: DC

## 2014-11-27 NOTE — Progress Notes (Signed)
Pre visit review using our clinic review tool, if applicable. No additional management support is needed unless otherwise documented below in the visit note.  Chief Complaint  Patient presents with  . Follow-up    depression meds    HPI: [name] 63 y.o. fu of depression Seems to be doing better    Still drinking   After work Depends .   Seeing counselor  Still some dpressive sx   No se of med ROS: See pertinent positives and negatives per HPI.  Past Medical History  Diagnosis Date  . Hyperlipidemia   . Depression   . History of vertigo   . ETOH abuse   . 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 Arrhythmia     PAF ON FLECAINIDE  . Hx of varicella   . Seasonal allergies   . Hx: UTI (urinary tract infection)   . Carotid artery occlusion     Pt unaware  . History of kidney stones   . Hemorrhoids   . Cancer     Melanoma- left cheek    Family History  Problem Relation Age of Onset  . Heart disease Father   . Alcohol abuse Father   . Bipolar disorder Father   . Dementia Mother   . Arthritis Mother   . Bipolar disorder Sister     father also   . Stroke Maternal Grandfather   . Heart disease Maternal Grandfather   . Lung cancer Paternal Grandmother     Social History   Social History  . Marital Status: Single    Spouse Name: N/A  . Number of Children: N/A  . Years of Education: N/A   Social History Main Topics  . Smoking status: Never Smoker   . Smokeless tobacco: Never Used  . Alcohol Use: 12.0 oz/week    0 Standard drinks or equivalent, 20 Cans of beer per week  . Drug Use: No  . Sexual Activity: No   Other Topics Concern  . 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    Drinks 6-8 beers on most days.  Believes she has an alcohol abuse problem.    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     Outpatient Prescriptions Prior to Visit  Medication Sig Dispense Refill  . aspirin 325 MG tablet Take 325 mg by mouth daily.      . CRESTOR 20 MG tablet TAKE (1/2) TABLET DAILY. 15 tablet 3  . diltiazem (CARDIZEM CD) 240 MG 24 hr capsule TAKE (1) CAPSULE DAILY. 30 capsule 11  . flecainide (TAMBOCOR) 50 MG tablet TAKE (2) TABLETS TWICE DAILY. 120 tablet 11  . ibuprofen (ADVIL,MOTRIN) 200 MG tablet Take 200 mg by mouth every 6 (six) hours as needed.    . sertraline (ZOLOFT) 100 MG tablet Take 2 tablets (200 mg total) by mouth daily. 180 tablet 2   No facility-administered medications prior to visit.     EXAM:  BP 130/72 mmHg  Temp(Src) 99.1 F (37.3 C) (Oral)  Wt 152 lb 8 oz (69.174 kg)  Body mass index is 29.05 kg/(m^2).  GENERAL: vitals reviewed and listed above, alert, oriented, appears well hydrated and in no acute distress MS: moves all extremities without noticeable focal  abnormality PSYCH: pleasant and cooperative, brighter affect eye contact nl speech   ASSESSMENT AND PLAN:  Discussed the following assessment and plan:  Depression - seems improved some  see text   Medication management  Heavy alcohol use - revewied  functioni ng   only on nights or weekends  strategies  pt agrees  History of atrial fibrillation Consider adding wellbutrin but only with input from cards cause of potential interaction with flecainide  And also if counseor thinks doing  Better can wait   -Patient advised to return or notify health care team  if symptoms worsen ,persist or new concerns arise.  Patient Instructions  Consideration of adding low dose wellbutrin  To the zoloft  But need ok from cardiology because of potenetial interactions.   Also disc with counselor about advisability  Plan ROV in 2 months or as needed  .      Standley Brooking. Jihad Brownlow M.D.

## 2014-11-27 NOTE — Patient Instructions (Signed)
Consideration of adding low dose wellbutrin  To the zoloft  But need ok from cardiology because of potenetial interactions.   Also disc with counselor about advisability  Plan ROV in 2 months or as needed  .

## 2014-11-28 ENCOUNTER — Encounter (INDEPENDENT_AMBULATORY_CARE_PROVIDER_SITE_OTHER): Payer: BC Managed Care – PPO | Admitting: Ophthalmology

## 2014-11-28 DIAGNOSIS — H33311 Horseshoe tear of retina without detachment, right eye: Secondary | ICD-10-CM | POA: Diagnosis not present

## 2014-12-13 ENCOUNTER — Encounter (INDEPENDENT_AMBULATORY_CARE_PROVIDER_SITE_OTHER): Payer: BC Managed Care – PPO | Admitting: Ophthalmology

## 2014-12-13 DIAGNOSIS — H33301 Unspecified retinal break, right eye: Secondary | ICD-10-CM

## 2014-12-19 ENCOUNTER — Other Ambulatory Visit: Payer: Self-pay | Admitting: Internal Medicine

## 2014-12-27 ENCOUNTER — Ambulatory Visit (INDEPENDENT_AMBULATORY_CARE_PROVIDER_SITE_OTHER): Payer: BC Managed Care – PPO | Admitting: Internal Medicine

## 2014-12-27 ENCOUNTER — Encounter: Payer: Self-pay | Admitting: Internal Medicine

## 2014-12-27 VITALS — BP 98/64 | HR 80 | Ht 63.0 in | Wt 150.1 lb

## 2014-12-27 DIAGNOSIS — E785 Hyperlipidemia, unspecified: Secondary | ICD-10-CM

## 2014-12-27 NOTE — Patient Instructions (Signed)
Your physician has recommended you make the following change in your medication:  1.) DECREASE CRESTOR TO 10 MG [place]  Your physician recommends that you return for lab work in: 2 MONTHS ([place])  Your physician wants you to follow-up in: [place].  You will receive a reminder letter in the mail two months in advance. If you don't receive a letter, please call our office to schedule the follow-up appointment.

## 2014-12-27 NOTE — Progress Notes (Signed)
Cardiology Office Note   Date:  12/27/2014   ID:  [name], DOB Jul 03, 1950, MRN [id number]  PCP:  Lottie Dawson, MD  Cardiologist:   Dorris Carnes, MD   No chief complaint on file.  F/U of afib     History of Present Illness: [name] is a 64 y.o. female with a history of atrial fib  I saw her in Sept 2015.   Since seen had eye surgery  Breathing good  No change in ability to do things Denies palpitations  Really has had symptoms   Breathing is OK        Current Outpatient Prescriptions  Medication Sig Dispense Refill  . aspirin 325 MG tablet Take 325 mg by mouth daily.      Marland Kitchen diltiazem (CARDIZEM CD) 240 MG 24 hr capsule Take 240 mg by mouth daily.    . flecainide (TAMBOCOR) 50 MG tablet Take 100 mg by mouth 2 (two) times daily.    Marland Kitchen ibuprofen (ADVIL,MOTRIN) 200 MG tablet Take 200 mg by mouth every 6 (six) hours as needed for moderate pain.     . rosuvastatin (CRESTOR) 20 MG tablet Take 20 mg by mouth daily.    . sertraline (ZOLOFT) 100 MG tablet Take 2 tablets (200 mg total) by mouth daily. 180 tablet 2   No current facility-administered medications for this visit.    Allergies:   Cephalosporins and Sulfonamide derivatives   Past Medical History  Diagnosis Date  . Hyperlipidemia   . Depression   . History of vertigo   . ETOH abuse   . 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 Arrhythmia     PAF ON FLECAINIDE  . Hx of varicella   . Seasonal allergies   . Hx: UTI (urinary tract infection)   . Carotid artery occlusion     Pt unaware  . History of kidney stones   . Hemorrhoids   . Cancer     Melanoma- left cheek    Past Surgical History  Procedure Laterality Date  . Melanoma excision  2010    Left Cheek  . Cataract  2015    Both eyes  . Eye surgery    . Colonoscopy    . Scleral buckle Right 07/31/2014    Procedure: SCLERAL BUCKLE RIGHT EYE;  Surgeon: Hayden Pedro, MD;  Location: [facility];  Service: Ophthalmology;  Laterality:  Right;  . Photocoagulation with laser Right 07/31/2014    Procedure: PHOTOCOAGULATION WITH LASER;  Surgeon: Hayden Pedro, MD;  Location: [facility];  Service: Ophthalmology;  Laterality: Right;     Social History:  The patient  reports that she has never smoked. She has never used smokeless tobacco. She reports that she drinks about 12.0 oz of alcohol per week. She reports that she does not use illicit drugs.   Family History:  The patient's family history includes Alcohol abuse in her father; Arthritis in her mother; Bipolar disorder in her father and sister; Dementia in her mother; Heart disease in her father and maternal grandfather; Lung cancer in her paternal grandmother; Stroke in her maternal grandfather.    ROS:  Please see the history of present illness. All other systems are reviewed and  Negative to the above problem except as noted.    PHYSICAL EXAM: VS:  BP 98/64 mmHg  Pulse 80  Ht 5\' 3"  (1.6 m)  Wt 150 lb 1.9 oz (68.094 kg)  BMI 26.60 kg/m2  SpO2 95%  GEN: Well nourished, well developed, in no acute distress HEENT: normal Neck: no JVD, carotid bruits, or masses Cardiac: RRR; no murmurs, rubs, or gallops,no edema  Respiratory:  clear to auscultation bilaterally, normal work of breathing GI: soft, nontender, nondistended, + BS  No hepatomegaly  MS: no deformity Moving all extremities   Skin: warm and dry, no rash Neuro:  Strength and sensation are intact Psych: euthymic mood, full affect   EKG:  EKG is not ordered today.   Lipid Panel    Component Value Date/Time   CHOL 167 05/30/2014 0826   TRIG 127.0 05/30/2014 0826   HDL 76.70 05/30/2014 0826   CHOLHDL 2 05/30/2014 0826   VLDL 25.4 05/30/2014 0826   LDLCALC 65 05/30/2014 0826   LDLDIRECT 124.3 05/31/2008 0858      Wt Readings from Last 3 Encounters:  12/27/14 150 lb 1.9 oz (68.094 kg)  11/27/14 152 lb 8 oz (69.174 kg)  09/27/14 156 lb (70.761 kg)      ASSESSMENT AND PLAN:  1  PAF  Pt without  symptoms  I would keep on current regimen    2.  HL    Labs in Feb LDL 2as 65  HDL was 76    Will decrease to 10 qod  To follow control    May do ok on 5 qd.  F?U in 1 year       Signed, Dorris Carnes, MD  12/27/2014 9:38 AM    [address], [place], [place]  [address] Phone: [phone]; Fax: [phone]

## 2015-01-09 ENCOUNTER — Ambulatory Visit
Admission: RE | Admit: 2015-01-09 | Discharge: 2015-01-09 | Disposition: A | Discharge status: Home / Self Care | Payer: BC Managed Care – PPO | Source: Ambulatory Visit | Attending: Internal Medicine | Admitting: Internal Medicine

## 2015-01-09 ENCOUNTER — Other Ambulatory Visit: Payer: Self-pay | Admitting: Internal Medicine

## 2015-01-09 ENCOUNTER — Encounter: Payer: Self-pay | Admitting: Internal Medicine

## 2015-01-09 ENCOUNTER — Ambulatory Visit (INDEPENDENT_AMBULATORY_CARE_PROVIDER_SITE_OTHER): Payer: BC Managed Care – PPO | Admitting: Internal Medicine

## 2015-01-09 VITALS — BP 112/66 | Temp 98.9°F | Wt 151.8 lb

## 2015-01-09 DIAGNOSIS — R296 Repeated falls: Secondary | ICD-10-CM

## 2015-01-09 DIAGNOSIS — M25462 Effusion, left knee: Secondary | ICD-10-CM | POA: Diagnosis not present

## 2015-01-09 DIAGNOSIS — S8002XA Contusion of left knee, initial encounter: Secondary | ICD-10-CM

## 2015-01-09 DIAGNOSIS — Z9181 History of falling: Secondary | ICD-10-CM

## 2015-01-09 NOTE — Patient Instructions (Signed)
Get x ray  For contusion  Of left knee  You have some traumatic bursitis    Ice every 20 - 30 minutes as possible.   May take 1-2 weeks to get better but if getting worse get back with [place] sooner  .  Hold asa if on ibuprofen

## 2015-01-09 NOTE — Progress Notes (Signed)
Pre visit review using our clinic review tool, if applicable. No additional management support is needed unless otherwise documented below in the visit note.  Chief Complaint  Patient presents with  . Knee Injury    [address] on sidewalk yesterday.  Has some bruising, scrapes and redness.    HPI: Patient [name]  comes in today for SDA for  new problem evaluation. [name] was coming out of a restaurant with some friends and was changing her glasses to sunglasses and missed a step near the curb and fell forward. Has no major head trauma but hit both knees to the pavement pretty hard. Also has a scrape on her right elbow. She used ice it has some swelling and pain on walking with her left knee. No previous injury. No fullness instability feeling. Takes aspirin has used ibuprofen. ROS: See pertinent positives and negatives per HPI.  Past Medical History  Diagnosis Date  . Hyperlipidemia   . Depression   . History of vertigo   . ETOH abuse   . 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 Arrhythmia     PAF ON FLECAINIDE  . Hx of varicella   . Seasonal allergies   . Hx: UTI (urinary tract infection)   . Carotid artery occlusion     Pt unaware  . History of kidney stones   . Hemorrhoids   . Cancer ([place])     Melanoma- left cheek    Family History  Problem Relation Age of Onset  . Heart disease Father   . Alcohol abuse Father   . Bipolar disorder Father   . Dementia Mother   . Arthritis Mother   . Bipolar disorder Sister     father also   . Stroke Maternal Grandfather   . Heart disease Maternal Grandfather   . Lung cancer Paternal Grandmother     Social History   Social History  . Marital Status: Single    Spouse Name: N/A  . Number of Children: N/A  . Years of Education: N/A   Social History Main Topics  . Smoking status: Never Smoker   . Smokeless tobacco: Never Used  . Alcohol Use: 12.0 oz/week    0 Standard drinks or equivalent, 20 Cans of beer per week    . Drug Use: No  . Sexual Activity: No   Other Topics Concern  . 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    Drinks 6-8 beers on most days.  Believes she has an alcohol abuse problem.    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     Outpatient Prescriptions Prior to Visit  Medication Sig Dispense Refill  . aspirin 325 MG tablet Take 325 mg by mouth daily.      Marland Kitchen diltiazem (CARDIZEM CD) 240 MG 24 hr capsule Take 240 mg by mouth daily.    . flecainide (TAMBOCOR) 50 MG tablet Take 100 mg by mouth 2 (two) times daily.    Marland Kitchen ibuprofen (ADVIL,MOTRIN) 200 MG tablet Take 200 mg by mouth every 6 (six) hours as needed for moderate pain.     . rosuvastatin (CRESTOR) 20 MG tablet Take 10 mg by mouth every other day.    . sertraline (ZOLOFT) 100 MG tablet  Take 2 tablets (200 mg total) by mouth daily. 180 tablet 2   No facility-administered medications prior to visit.     EXAM:  BP 112/66 mmHg  Temp(Src) 98.9 F (37.2 C) (Oral)  Wt 151 lb 12.8 oz (68.856 kg)  Body mass index is 26.9 kg/(m^2).  GENERAL: vitals reviewed and listed above, alert, oriented, appears well hydrated and in no acute distress HEENT: atraumatic, conjunctiva  clear, no obvious abnormalities on inspection of external nose and ears MS: moves all extremities walks with a slight limp but can weight-bear pretty easily. Examination of both knees right with contusions without swelling or point tenderness left +2 subpatellar swelling with some contusion no point tenderness but mostly tender over the patella inferior. Range of motion normal more painful with extension. No posterior swelling instability or joint line tenderness.  PSYCH: pleasant and cooperative, no obvious depression or anxiety  ASSESSMENT AND PLAN:  Discussed the following assessment and  plan:  Knee contusion, left, initial encounter - Plan: DG Knee Complete 4 Views Left  Hx of falling, presenting hazards to health - Plan: DG Knee Complete 4 Views Left  Knee swelling, left - Plan: DG Knee Complete 4 Views Left at this time do not suspect internal derangement but does have. If patellar swelling check x-ray today conservative therapy ice rest ibuprofen as tolerated but not with aspirin. Expectant management and follow-up if not improving or getting worse. -Patient advised to return or notify health care team  if symptoms worsen ,persist or new concerns arise.  Patient Instructions  Get x ray  For contusion  Of left knee  You have some traumatic bursitis    Ice every 20 - 30 minutes as possible.   May take 1-2 weeks to get better but if getting worse get back with [place] sooner  .  Hold asa if on ibuprofen    [name] M.D.

## 2015-01-17 ENCOUNTER — Other Ambulatory Visit: Payer: Self-pay | Admitting: Internal Medicine

## 2015-02-26 ENCOUNTER — Other Ambulatory Visit (INDEPENDENT_AMBULATORY_CARE_PROVIDER_SITE_OTHER): Payer: BC Managed Care – PPO | Admitting: *Deleted

## 2015-02-26 DIAGNOSIS — E7800 Pure hypercholesterolemia, unspecified: Secondary | ICD-10-CM

## 2015-02-26 LAB — LIPID PANEL
Cholesterol: 196 mg/dL (ref 125–200)
HDL: 72 mg/dL (ref >=46)
LDL CALC: 101 mg/dL (ref <130)
TRIGLYCERIDES: 117 mg/dL (ref <150)
Total CHOL/HDL Ratio: 2.7 Ratio (ref <=5.0)
VLDL: 23 mg/dL (ref <30)

## 2015-02-26 NOTE — Addendum Note (Signed)
Addended by: Eulis Foster on: 02/26/2015 07:42 AM   Modules accepted: Orders

## 2015-02-26 NOTE — Addendum Note (Signed)
Addended by: Eulis Foster on: 02/26/2015 07:50 AM   Modules accepted: Orders

## 2015-03-04 ENCOUNTER — Other Ambulatory Visit: Payer: Self-pay | Admitting: *Deleted

## 2015-03-04 DIAGNOSIS — E7800 Pure hypercholesterolemia, unspecified: Secondary | ICD-10-CM

## 2015-04-18 ENCOUNTER — Ambulatory Visit (INDEPENDENT_AMBULATORY_CARE_PROVIDER_SITE_OTHER): Payer: BC Managed Care – PPO | Admitting: Ophthalmology

## 2015-04-18 DIAGNOSIS — H338 Other retinal detachments: Secondary | ICD-10-CM

## 2015-04-18 DIAGNOSIS — H43813 Vitreous degeneration, bilateral: Secondary | ICD-10-CM | POA: Diagnosis not present

## 2015-04-18 DIAGNOSIS — H33302 Unspecified retinal break, left eye: Secondary | ICD-10-CM | POA: Diagnosis not present

## 2015-04-18 DIAGNOSIS — H26493 Other secondary cataract, bilateral: Secondary | ICD-10-CM | POA: Diagnosis not present

## 2015-05-06 ENCOUNTER — Ambulatory Visit (INDEPENDENT_AMBULATORY_CARE_PROVIDER_SITE_OTHER): Payer: BC Managed Care – PPO | Admitting: Internal Medicine

## 2015-05-06 ENCOUNTER — Encounter: Payer: Self-pay | Admitting: Internal Medicine

## 2015-05-06 VITALS — BP 142/88 | Temp 97.8°F | Wt 153.0 lb

## 2015-05-06 DIAGNOSIS — M25511 Pain in right shoulder: Secondary | ICD-10-CM

## 2015-05-06 MED ORDER — NAPROXEN 500 MG PO TABS: 500.0000 mg | ORAL_TABLET | Freq: Two times a day (BID) | ORAL | Status: AC

## 2015-05-06 NOTE — Progress Notes (Signed)
Pre visit review using our clinic review tool, if applicable. No additional management support is needed unless otherwise documented below in the visit note.  Chief Complaint  Patient presents with  . Right Shoulder Pain    2+ weeks    HPI: Patient [name]  comes in today for SDA for  new problem evaluation. Insidious onset of right shoulder area pain without injury specific overuse new movement. She is right-handed. Positive some pain behind her right shoulder decreased elevation anterior and into her rotation pain. Hasn't had this before no specific treatment did use some ice and then heat topicals some relief pain isn't severe all the time not worse at night. No history of same. ROS: See pertinent positives and negatives per HPI. No hx bleeding  On asa for  ASA   paf preventions  Past Medical History  Diagnosis Date  . Hyperlipidemia   . Depression   . History of vertigo   . ETOH abuse   . 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 Arrhythmia     PAF ON FLECAINIDE  . Hx of varicella   . Seasonal allergies   . Hx: UTI (urinary tract infection)   . Carotid artery occlusion     Pt unaware  . History of kidney stones   . Hemorrhoids   . Cancer ([place])     Melanoma- left cheek    Family History  Problem Relation Age of Onset  . Heart disease Father   . Alcohol abuse Father   . Bipolar disorder Father   . Dementia Mother   . Arthritis Mother   . Bipolar disorder Sister     father also   . Stroke Maternal Grandfather   . Heart disease Maternal Grandfather   . Lung cancer Paternal Grandmother     Social History   Social History  . Marital Status: Single    Spouse Name: N/A  . Number of Children: N/A  . Years of Education: N/A   Social History Main Topics  . Smoking status: Never Smoker   . Smokeless tobacco: Never Used  . Alcohol Use: 12.0 oz/week    0 Standard drinks or equivalent, 20 Cans of beer per week  . Drug Use: No  . Sexual Activity: No    Other Topics Concern  . 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    Drinks 6-8 beers on most days.  Believes she has an alcohol abuse problem.    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     Outpatient Prescriptions Prior to Visit  Medication Sig Dispense Refill  . aspirin 325 MG tablet Take 325 mg by mouth daily.      Marland Kitchen diltiazem (CARDIZEM CD) 240 MG 24 hr capsule Take 1 capsule (240 mg total) by mouth daily. 30 capsule 11  . flecainide (TAMBOCOR) 50 MG tablet TAKE (2) TABLETS TWICE DAILY. 120 tablet 2  . ibuprofen (ADVIL,MOTRIN) 200 MG tablet Take 200 mg by mouth every 6 (six) hours as needed for moderate pain.     . rosuvastatin (CRESTOR) 20 MG tablet Take 0.5 tablets (10 mg total) by mouth every other day. 8 tablet 11  . sertraline (ZOLOFT) 100 MG tablet Take 2 tablets (200 mg  total) by mouth daily. 180 tablet 2   No facility-administered medications prior to visit.     EXAM:  BP 142/88 mmHg  Temp(Src) 97.8 F (36.6 C) (Oral)  Wt 153 lb (69.4 kg)  Body mass index is 27.11 kg/(m^2).  GENERAL: vitals reviewed and listed above, alert, oriented, appears well hydrated and in no acute distress HEENT: atraumatic, conjunctiva  clear, no obvious abnormalities on inspection of external nose and ears NECK: no obvious masses on inspection palpation  MS: moves all extremities   no point tenderness right shoulder pain more localized to posterior shoulder pain with internal rotation and external rotation elevation above 90 grip strength upper extremity normal no obvious atrophy redness or adenopathy PSYCH: pleasant and cooperative, no obvious   ASSESSMENT AND PLAN:  Discussed the following assessment and plan:  Right shoulder pain -  overuse bursitis  ? mechanical expectant managment nsaid and fu  or sm if  persistent .   -Patient advised to return or notify health care team  if symptoms worsen ,persist or new concerns arise.  Patient Instructions  Take antiinflammatory   for 2 weeks and if not helpful may get  Sports medicine.  Caution asa with nsaids  If not improving in the next 2 weeks contact us . Or if worse .        Shoulder Pain The shoulder is the joint that connects your arms to your body. The bones that form the shoulder joint include the upper arm bone (humerus), the shoulder blade (scapula), and the collarbone (clavicle). The top of the humerus is shaped like a ball and fits into a rather flat socket on the scapula (glenoid cavity). A combination of muscles and strong, fibrous tissues that connect muscles to bones (tendons) support your shoulder joint and hold the ball in the socket. Small, fluid-filled sacs (bursae) are located in different areas of the joint. They act as cushions between the bones and the overlying soft tissues and help reduce friction between the gliding tendons and the bone as you move your arm. Your shoulder joint allows a wide range of motion in your arm. This range of motion allows you to do things like scratch your back or throw a ball. However, this range of motion also makes your shoulder more prone to pain from overuse and injury. Causes of shoulder pain can originate from both injury and overuse and usually can be grouped in the following four categories:  Redness, swelling, and pain (inflammation) of the tendon (tendinitis) or the bursae (bursitis).  Instability, such as a dislocation of the joint.  Inflammation of the joint (arthritis).  Broken bone (fracture). HOME CARE INSTRUCTIONS   Apply ice to the sore area.  Put ice in a plastic bag.  Place a towel between your skin and the bag.  Leave the ice on for 15-20 minutes, 3-4 times per day for the first 2 days, or as directed by your health care provider.  Stop using cold packs if they do not  help with the pain.  If you have a shoulder sling or immobilizer, wear it as long as your caregiver instructs. Only remove it to shower or bathe. Move your arm as little as possible, but keep your hand moving to prevent swelling.  Squeeze a soft ball or foam pad as much as possible to help prevent swelling.  Only take over-the-counter or prescription medicines for pain, discomfort, or fever as directed by your caregiver. SEEK MEDICAL CARE IF:   Your shoulder  pain increases, or new pain develops in your arm, hand, or fingers.  Your hand or fingers become cold and numb.  Your pain is not relieved with medicines. SEEK IMMEDIATE MEDICAL CARE IF:   Your arm, hand, or fingers are numb or tingling.  Your arm, hand, or fingers are significantly swollen or turn white or blue. MAKE SURE YOU:   Understand these instructions.  Will watch your condition.  Will get help right away if you are not doing well or get worse.   This information is not intended to replace advice given to you by your health care provider. Make sure you discuss any questions you have with your health care provider.   Document Released: 12/31/2004 Document Revised: 04/13/2014 Document Reviewed: 07/16/2014 Elsevier Interactive Patient Education 2016 [place] K. Amariona Rathje M.D.

## 2015-05-06 NOTE — Patient Instructions (Addendum)
Take antiinflammatory   for 2 weeks and if not helpful may get  Sports medicine.  Caution asa with nsaids  If not improving in the next 2 weeks contact us . Or if worse .        Shoulder Pain The shoulder is the joint that connects your arms to your body. The bones that form the shoulder joint include the upper arm bone (humerus), the shoulder blade (scapula), and the collarbone (clavicle). The top of the humerus is shaped like a ball and fits into a rather flat socket on the scapula (glenoid cavity). A combination of muscles and strong, fibrous tissues that connect muscles to bones (tendons) support your shoulder joint and hold the ball in the socket. Small, fluid-filled sacs (bursae) are located in different areas of the joint. They act as cushions between the bones and the overlying soft tissues and help reduce friction between the gliding tendons and the bone as you move your arm. Your shoulder joint allows a wide range of motion in your arm. This range of motion allows you to do things like scratch your back or throw a ball. However, this range of motion also makes your shoulder more prone to pain from overuse and injury. Causes of shoulder pain can originate from both injury and overuse and usually can be grouped in the following four categories:  Redness, swelling, and pain (inflammation) of the tendon (tendinitis) or the bursae (bursitis).  Instability, such as a dislocation of the joint.  Inflammation of the joint (arthritis).  Broken bone (fracture). HOME CARE INSTRUCTIONS   Apply ice to the sore area.  Put ice in a plastic bag.  Place a towel between your skin and the bag.  Leave the ice on for 15-20 minutes, 3-4 times per day for the first 2 days, or as directed by your health care provider.  Stop using cold packs if they do not help with the pain.  If you have a shoulder sling or immobilizer, wear it as long as your caregiver instructs. Only remove it to shower or bathe.  Move your arm as little as possible, but keep your hand moving to prevent swelling.  Squeeze a soft ball or foam pad as much as possible to help prevent swelling.  Only take over-the-counter or prescription medicines for pain, discomfort, or fever as directed by your caregiver. SEEK MEDICAL CARE IF:   Your shoulder pain increases, or new pain develops in your arm, hand, or fingers.  Your hand or fingers become cold and numb.  Your pain is not relieved with medicines. SEEK IMMEDIATE MEDICAL CARE IF:   Your arm, hand, or fingers are numb or tingling.  Your arm, hand, or fingers are significantly swollen or turn white or blue. MAKE SURE YOU:   Understand these instructions.  Will watch your condition.  Will get help right away if you are not doing well or get worse.   This information is not intended to replace advice given to you by your health care provider. Make sure you discuss any questions you have with your health care provider.   Document Released: 12/31/2004 Document Revised: 04/13/2014 Document Reviewed: 07/16/2014 Elsevier Interactive Patient Education [employer].

## 2015-05-07 ENCOUNTER — Telehealth: Payer: Self-pay | Admitting: Internal Medicine

## 2015-05-07 NOTE — Telephone Encounter (Signed)
New problem   Pt want to know if she can stop taking her aspirin for 10 days while she is taking an anti inflammatory medicine for her pain.

## 2015-05-07 NOTE — Telephone Encounter (Signed)
Reviewed with Fuller Canada, PharmD and pt should continue ASA. Naproxen is ordered for 2 weeks.   She also has Ibuprofen on med list and should not take this while taking Naproxen.  Take Naproxen with food and watch for any signs of bleeding. I spoke with pt and gave her this information.

## 2015-05-23 ENCOUNTER — Other Ambulatory Visit (INDEPENDENT_AMBULATORY_CARE_PROVIDER_SITE_OTHER): Payer: BC Managed Care – PPO | Admitting: *Deleted

## 2015-05-23 DIAGNOSIS — E7800 Pure hypercholesterolemia, unspecified: Secondary | ICD-10-CM

## 2015-05-23 LAB — LIPID PANEL
CHOL/HDL RATIO: 3.3 ratio (ref <=5.0)
CHOLESTEROL: 240 mg/dL — AB (ref 125–200)
HDL: 72 mg/dL (ref >=46)
LDL CALC: 126 mg/dL (ref <130)
Triglycerides: 212 mg/dL — ABNORMAL HIGH (ref <150)
VLDL: 42 mg/dL — AB (ref <30)

## 2015-06-06 ENCOUNTER — Encounter: Payer: Self-pay | Admitting: *Deleted

## 2015-06-24 ENCOUNTER — Ambulatory Visit (INDEPENDENT_AMBULATORY_CARE_PROVIDER_SITE_OTHER): Payer: BC Managed Care – PPO | Admitting: Ophthalmology

## 2015-06-25 ENCOUNTER — Other Ambulatory Visit: Payer: Self-pay | Admitting: Internal Medicine

## 2015-06-27 ENCOUNTER — Telehealth: Payer: Self-pay | Admitting: Family Medicine

## 2015-06-27 ENCOUNTER — Other Ambulatory Visit: Payer: Self-pay | Admitting: Family Medicine

## 2015-06-27 DIAGNOSIS — Z0000 Encounter for general adult medical examination without abnormal findings: Secondary | ICD-10-CM

## 2015-06-27 NOTE — Telephone Encounter (Signed)
Pt has been sch

## 2015-06-27 NOTE — Telephone Encounter (Signed)
Sent to the pharmacy for 3 months.  Pt now due for cpx.  Message sent to scheduling.

## 2015-06-27 NOTE — Telephone Encounter (Signed)
Pt now due for cpx and lab work.  I have placed the fasting lab orders.  Please help her to make both appointments.  Thanks!

## 2015-06-28 ENCOUNTER — Other Ambulatory Visit: Payer: Self-pay | Admitting: Internal Medicine

## 2015-07-15 ENCOUNTER — Telehealth: Payer: Self-pay | Admitting: Internal Medicine

## 2015-07-15 MED ORDER — ROSUVASTATIN CALCIUM 10 MG PO TABS: 10.0000 mg | ORAL_TABLET | Freq: Every day | ORAL | Status: DC

## 2015-07-15 NOTE — Telephone Encounter (Signed)
Patient called back regarding a letter she received to contact the office for results.  Notes Recorded by Fay Records, MD on 05/23/2015 at 5:57 PM Trigs a little high at 212 Watch carbs LDL is higher at 126 I would increase crestor to 10 mg per day  Instructed patient to limit carbohydrates and to INCREASE CRESTOR to 10 mg daily. Scheduled patient for one year FU OV 12/20/15 at 0800. Patient understands to come fasting to appointment for follow-up labs unless Dr. Harrington Challenger' nurse calls to schedule sooner lab draws. Patient was grateful for call.

## 2015-07-15 NOTE — Telephone Encounter (Signed)
New message    Pt is calling for the rn

## 2015-08-06 ENCOUNTER — Other Ambulatory Visit: Payer: Self-pay

## 2015-08-06 MED ORDER — ROSUVASTATIN CALCIUM 10 MG PO TABS: 10.0000 mg | ORAL_TABLET | Freq: Every day | ORAL | Status: DC

## 2015-08-06 NOTE — Telephone Encounter (Signed)
Theodoro Parma, RN at 07/15/2015 3:13 PM     Status: Signed       Expand All Collapse All   Patient called back regarding a letter she received to contact the office for results.  Notes Recorded by Fay Records, MD on 05/23/2015 at 5:57 PM Trigs a little high at 212 Watch carbs LDL is higher at 126 I would increase crestor to 10 mg per day  Instructed patient to limit carbohydrates and to INCREASE CRESTOR to 10 mg daily. Scheduled patient for one year FU OV 12/20/15 at 0800. Patient understands to come fasting to appointment for follow-up labs unless Dr. Harrington Challenger' nurse calls to schedule sooner lab draws. Patient was grateful for call.             Charlett Nose at 07/15/2015 2:34 PM     Status: Signed       Expand All Collapse All   New message    Pt is calling for the rn             Approved      Disp Refills Start End    rosuvastatin (CRESTOR) 10 MG tablet 30 tablet 11 07/15/2015     Sig - Route:  Take 1 tablet (10 mg total) by mouth daily. - Oral    Class:  No Print    DAW:  No    Comment:  New sig    Authorizing Provider:  Fay Records, MD    Ordering User:  Theodoro Parma, [place], [address].   Notes Recorded by Fay Records, MD on 05/23/2015 at 5:57 PM Trigs a little high at 212 Watch carbs LDL is higher at 126 I would increase crestor to 10 mg per day

## 2015-08-07 ENCOUNTER — Other Ambulatory Visit: Payer: Self-pay

## 2015-08-07 MED ORDER — ROSUVASTATIN CALCIUM 10 MG PO TABS: 10.0000 mg | ORAL_TABLET | Freq: Every day | ORAL | Status: AC

## 2015-09-23 ENCOUNTER — Other Ambulatory Visit: Payer: Self-pay | Admitting: Internal Medicine

## 2015-09-24 ENCOUNTER — Other Ambulatory Visit: Payer: Self-pay | Admitting: General Practice

## 2015-09-24 MED ORDER — SERTRALINE HCL 100 MG PO TABS: ORAL_TABLET | ORAL | Status: AC

## 2015-10-16 ENCOUNTER — Ambulatory Visit (INDEPENDENT_AMBULATORY_CARE_PROVIDER_SITE_OTHER): Payer: BC Managed Care – PPO | Admitting: Ophthalmology

## 2015-10-16 ENCOUNTER — Other Ambulatory Visit (INDEPENDENT_AMBULATORY_CARE_PROVIDER_SITE_OTHER): Payer: BC Managed Care – PPO

## 2015-10-16 DIAGNOSIS — H43813 Vitreous degeneration, bilateral: Secondary | ICD-10-CM | POA: Diagnosis not present

## 2015-10-16 DIAGNOSIS — Z0000 Encounter for general adult medical examination without abnormal findings: Secondary | ICD-10-CM | POA: Diagnosis not present

## 2015-10-16 DIAGNOSIS — H338 Other retinal detachments: Secondary | ICD-10-CM

## 2015-10-16 DIAGNOSIS — H33302 Unspecified retinal break, left eye: Secondary | ICD-10-CM

## 2015-10-16 LAB — CBC WITH DIFFERENTIAL/PLATELET
BASOS ABS: 0 10*3/uL (ref 0.0–0.1)
Basophils Relative: 0.3 % (ref 0.0–3.0)
EOS ABS: 0.2 10*3/uL (ref 0.0–0.7)
EOS PCT: 2.3 % (ref 0.0–5.0)
HCT: 40.7 % (ref 36.0–46.0)
Hemoglobin: 13.9 g/dL (ref 12.0–15.0)
LYMPHS ABS: 1.1 10*3/uL (ref 0.7–4.0)
LYMPHS PCT: 12.4 % (ref 12.0–46.0)
MCHC: 34.3 g/dL (ref 30.0–36.0)
MCV: 96.5 fl (ref 78.0–100.0)
MONOS PCT: 5.3 % (ref 3.0–12.0)
Monocytes Absolute: 0.5 10*3/uL (ref 0.1–1.0)
NEUTROS PCT: 79.7 % — AB (ref 43.0–77.0)
Neutro Abs: 6.9 10*3/uL (ref 1.4–7.7)
PLATELETS: 173 10*3/uL (ref 150.0–400.0)
RBC: 4.22 Mil/uL (ref 3.87–5.11)
RDW: 12.1 % (ref 11.5–15.5)
WBC: 8.7 10*3/uL (ref 4.0–10.5)

## 2015-10-16 LAB — BASIC METABOLIC PANEL
BUN: 14 mg/dL (ref 6–23)
CALCIUM: 9.7 mg/dL (ref 8.4–10.5)
CO2: 27 mEq/L (ref 19–32)
Chloride: 105 mEq/L (ref 96–112)
Creatinine, Ser: 0.75 mg/dL (ref 0.40–1.20)
GFR: 82.47 mL/min (ref >60.00)
Glucose, Bld: 111 mg/dL — ABNORMAL HIGH (ref 70–99)
POTASSIUM: 4 meq/L (ref 3.5–5.1)
SODIUM: 141 meq/L (ref 135–145)

## 2015-10-16 LAB — HEPATIC FUNCTION PANEL
ALK PHOS: 64 U/L (ref 39–117)
ALT: 18 U/L (ref 0–35)
AST: 30 U/L (ref 0–37)
Albumin: 4.3 g/dL (ref 3.5–5.2)
BILIRUBIN DIRECT: 0.1 mg/dL (ref 0.0–0.3)
TOTAL PROTEIN: 7.2 g/dL (ref 6.0–8.3)
Total Bilirubin: 0.8 mg/dL (ref 0.2–1.2)

## 2015-10-16 LAB — LIPID PANEL
Cholesterol: 259 mg/dL — ABNORMAL HIGH (ref 0–200)
HDL: 85.5 mg/dL (ref >39.00)
LDL Cholesterol: 152 mg/dL — ABNORMAL HIGH (ref 0–99)
NONHDL: 173.24
Total CHOL/HDL Ratio: 3
Triglycerides: 106 mg/dL (ref 0.0–149.0)
VLDL: 21.2 mg/dL (ref 0.0–40.0)

## 2015-10-16 LAB — TSH: TSH: 1.83 u[IU]/mL (ref 0.35–4.50)

## 2015-10-22 NOTE — Patient Instructions (Addendum)
Get your mammogram .  [facility]   Screening .  Sugar is up some . Avoid simple carbs .    Processed  Sugars  Try a pedometer  Plan a dexa scan next year ( bone density)  ROV in 6 months med  . Check or as needed.      Health Maintenance, Female Adopting a healthy lifestyle and getting preventive care can go a long way to promote health and wellness. Talk with your health care provider about what schedule of regular examinations is right for you. This is a good chance for you to check in with your provider about disease prevention and staying healthy. In between checkups, there are plenty of things you can do on your own. Experts have done a lot of research about which lifestyle changes and preventive measures are most likely to keep you healthy. Ask your health care provider for more information. Weight T AND DIET  Eat a healthy diet  Be sure to include plenty of vegetables, fruits, low-fat dairy products, and lean protein.  Do not eat a lot of foods high in solid fats, added sugars, or salt.  Get regular exercise. This is one of the most important things you can do for your health.  Most adults should exercise for at least 150 minutes each week. The exercise should increase your heart rate and make you sweat (moderate-intensity exercise).  Most adults should also do strengthening exercises at least twice a week. This is in addition to the moderate-intensity exercise.  Maintain a healthy weight  Body mass index (BMI) is a measurement that can be used to identify possible weight problems. It estimates body fat based on height and weight. Your health care provider can help determine your BMI and help you achieve or maintain a healthy weight.  For females 46 years of age and older:   A BMI below 18.5 is considered underweight.  A BMI of 18.5 to 24.9 is normal.  A BMI of 25 to 29.9 is considered overweight.  A BMI of 30 and above is considered obese.  Watch  levels of cholesterol and blood lipids  You should start having your blood tested for lipids and cholesterol at 65 years of age, then have this test every 5 years.  You may need to have your cholesterol levels checked more often if:  Your lipid or cholesterol levels are high.  You are older than 65 years of age.  You are at high risk for heart disease.  CANCER SCREENING   Lung Cancer  Lung cancer screening is recommended for adults 47-63 years old who are at high risk for lung cancer because of a history of smoking.  A yearly low-dose CT scan of the lungs is recommended for people who:  Currently smoke.  Have quit within the past 15 years.  Have at least a 30-pack-year history of smoking. A pack year is smoking an average of one pack of cigarettes a day for 1 year.  Yearly screening should continue until it has been 15 years since you quit.  Yearly screening should stop if you develop a health problem that would prevent you from having lung cancer treatment.  Breast Cancer  Practice breast self-awareness. This means understanding how your breasts normally appear and feel.  It also means doing regular breast self-exams. Let your health care provider know about any changes, no matter how small.  If you are in your 20s or 30s, you should have a  clinical breast exam (CBE) by a health care provider every 1-3 years as part of a regular health exam.  If you are 49 or older, have a CBE every year. Also consider having a breast X-ray (mammogram) every year.  If you have a family history of breast cancer, talk to your health care provider about genetic screening.  If you are at high risk for breast cancer, talk to your health care provider about having an MRI and a mammogram every year.  Breast cancer gene (BRCA) assessment is recommended for women who have family members with BRCA-related cancers. BRCA-related cancers include:  Breast.  Ovarian.  Tubal.  Peritoneal  cancers.  Results of the assessment will determine the need for genetic counseling and BRCA1 and BRCA2 testing. Cervical Cancer Your health care provider may recommend that you be screened regularly for cancer of the pelvic organs (ovaries, uterus, and vagina). This screening involves a pelvic examination, including checking for microscopic changes to the surface of your cervix (Pap test). You may be encouraged to have this screening done every 3 years, beginning at age 65.  For women ages 90-65, health care providers may recommend pelvic exams and Pap testing every 3 years, or they may recommend the Pap and pelvic exam, combined with testing for human papilloma virus (HPV), every 5 years. Some types of HPV increase your risk of cervical cancer. Testing for HPV may also be done on women of any age with unclear Pap test results.  Other health care providers may not recommend any screening for nonpregnant women who are considered low risk for pelvic cancer and who do not have symptoms. Ask your health care provider if a screening pelvic exam is right for you.  If you have had past treatment for cervical cancer or a condition that could lead to cancer, you need Pap tests and screening for cancer for at least 20 years after your treatment. If Pap tests have been discontinued, your risk factors (such as having a new sexual partner) need to be reassessed to determine if screening should resume. Some women have medical problems that increase the chance of getting cervical cancer. In these cases, your health care provider may recommend more frequent screening and Pap tests. Colorectal Cancer  This type of cancer can be detected and often prevented.  Routine colorectal cancer screening usually begins at 65 years of age and continues through 65 years of age.  Your health care provider may recommend screening at an earlier age if you have risk factors for colon cancer.  Your health care provider may also  recommend using home test kits to check for hidden blood in the stool.  A small camera at the end of a tube can be used to examine your colon directly (sigmoidoscopy or colonoscopy). This is done to check for the earliest forms of colorectal cancer.  Routine screening usually begins at age 66.  Direct examination of the colon should be repeated every 5-10 years through 64 years of age. However, you may need to be screened more often if early forms of precancerous polyps or small growths are found. Skin Cancer  Check your skin from head to toe regularly.  Tell your health care provider about any new moles or changes in moles, especially if there is a change in a mole's shape or color.  Also tell your health care provider if you have a mole that is larger than the size of a pencil eraser.  Always use sunscreen. Apply sunscreen liberally  and repeatedly throughout the day.  Protect yourself by wearing long sleeves, pants, a wide-brimmed hat, and sunglasses whenever you are outside. HEART DISEASE, DIABETES, AND HIGH BLOOD PRESSURE   High blood pressure causes heart disease and increases the risk of stroke. High blood pressure is more likely to develop in:  People who have blood pressure in the high end of the normal range (130-139/85-89 mm Hg).  People who are overweight or obese.  People who are African American.  If you are 60-31 years of age, have your blood pressure checked every 3-5 years. If you are 78 years of age or older, have your blood pressure checked every year. You should have your blood pressure measured twice--once when you are at a hospital or clinic, and once when you are not at a hospital or clinic. Record the average of the two measurements. To check your blood pressure when you are not at a hospital or clinic, you can use:  An automated blood pressure machine at a pharmacy.  A home blood pressure monitor.  If you are between 51 years and 17 years old, ask your health  care provider if you should take aspirin to prevent strokes.  Have regular diabetes screenings. This involves taking a blood sample to check your fasting blood sugar level.  If you are at a normal weight and have a low risk for diabetes, have this test once every three years after 65 years of age.  If you are overweight and have a high risk for diabetes, consider being tested at a younger age or more often. PREVENTING INFECTION  Hepatitis B  If you have a higher risk for hepatitis B, you should be screened for this virus. You are considered at high risk for hepatitis B if:  You were born in a country where hepatitis B is common. Ask your health care provider which countries are considered high risk.  Your parents were born in a high-risk country, and you have not been immunized against hepatitis B (hepatitis B vaccine).  You have HIV or AIDS.  You use needles to inject street drugs.  You live with someone who has hepatitis B.  You have had sex with someone who has hepatitis B.  You get hemodialysis treatment.  You take certain medicines for conditions, including cancer, organ transplantation, and autoimmune conditions. Hepatitis C  Blood testing is recommended for:  Everyone born from [birth year] through 1965.  Anyone with known risk factors for hepatitis C. Sexually transmitted infections (STIs)  You should be screened for sexually transmitted infections (STIs) including gonorrhea and chlamydia if:  You are sexually active and are younger than 65 years of age.  You are older than 65 years of age and your health care provider tells you that you are at risk for this type of infection.  Your sexual activity has changed since you were last screened and you are at an increased risk for chlamydia or gonorrhea. Ask your health care provider if you are at risk.  If you do not have HIV, but are at risk, it may be recommended that you take a prescription medicine daily to prevent HIV  infection. This is called pre-exposure prophylaxis (PrEP). You are considered at risk if:  You are sexually active and do not regularly use condoms or know the HIV status of your partner(s).  You take drugs by injection.  You are sexually active with a partner who has HIV. Talk with your health care provider about whether you are  at high risk of being infected with HIV. If you choose to begin PrEP, you should first be tested for HIV. You should then be tested every 3 months for as long as you are taking PrEP.  PREGNANCY   If you are premenopausal and you may become pregnant, ask your health care provider about preconception counseling.  If you may become pregnant, take 400 to 800 micrograms (mcg) of folic acid every day.  If you want to prevent pregnancy, talk to your health care provider about birth control (contraception). OSTEOPOROSIS AND MENOPAUSE   Osteoporosis is a disease in which the bones lose minerals and strength with aging. This can result in serious bone fractures. Your risk for osteoporosis can be identified using a bone density scan.  If you are 31 years of age or older, or if you are at risk for osteoporosis and fractures, ask your health care provider if you should be screened.  Ask your health care provider whether you should take a calcium or vitamin D supplement to lower your risk for osteoporosis.  Menopause may have certain physical symptoms and risks.  Hormone replacement therapy may reduce some of these symptoms and risks. Talk to your health care provider about whether hormone replacement therapy is right for you.  HOME CARE INSTRUCTIONS   Schedule regular health, dental, and eye exams.  Stay current with your immunizations.   Do not use any tobacco products including cigarettes, chewing tobacco, or electronic cigarettes.  If you are pregnant, do not drink alcohol.  If you are breastfeeding, limit how much and how often you drink alcohol.  Limit  alcohol intake to no more than 1 drink per day for nonpregnant women. One drink equals 12 ounces of beer, 5 ounces of wine, or 1 ounces of hard liquor.  Do not use street drugs.  Do not share needles.  Ask your health care provider for help if you need support or information about quitting drugs.  Tell your health care provider if you often feel depressed.  Tell your health care provider if you have ever been abused or do not feel safe at home.   This information is not intended to replace advice given to you by your health care provider. Make sure you discuss any questions you have with your health care provider.   Document Released: 10/06/2010 Document Revised: 04/13/2014 Document Reviewed: 02/22/2013 [employer]vier Interactive Patient Education [employer].

## 2015-10-22 NOTE — Progress Notes (Signed)
Chief Complaint  Patient presents with  . Annual Exam    HPI: Patient  [name]  65 y.o. comes in today for Preventive Health Care visit  Lipids meds per dr Harrington Challenger Sertraline   ? Helping some   ... Counseling  Therapy every other week.  Had retinal problem and now better    Health Maintenance  Topic Date Due  . MAMMOGRAM  05/18/2009  . Hepatitis C Screening  05/05/2016 (Originally 12-11-1950)  . HIV Screening  05/05/2016 (Originally 12/20/1965)  . INFLUENZA VACCINE  11/24/2015  . PAP SMEAR  06/04/2017  . COLONOSCOPY  05/31/2019  . TETANUS/TDAP  08/29/2022  . ZOSTAVAX  Completed   Health Maintenance Review LIFESTYLE:  Exercise:  Trying to do some   Walk  15 - 20 minutes   Tobacco/ETS:no Alcohol: beer few every day  Sugar beverages: juice   oj some  Hoot tea    Iced tea  Goes out.  Sleep: about  7-+  Drug use: no HH of 1  Pet cats  Works 40 hours  [employer] will retire prob in a year .    ROS:  GEN/ HEENT: No fever, significant weight changes sweats headaches vision problems hearing changes, CV/ PULM; No chest pain shortness of breath cough, syncope,edema  change in exercise tolerance. GI /GU: No adominal pain, vomiting, change in bowel habits. No blood in the stool. No significant GU symptoms. SKIN/HEME: ,no acute skin rashes suspicious lesions or bleeding. No lymphadenopathy, nodules, masses.  NEURO/ PSYCH:  No neurologic signs such as weakness numbness. No depression anxiety. IMM/ Allergy: No unusual infections.  Allergy .   REST of 12 system review negative except as per HPI   Past Medical History  Diagnosis Date  . Hyperlipidemia   . Depression   . History of vertigo   . ETOH abuse   . 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 Arrhythmia     PAF ON FLECAINIDE  . Hx of varicella   . Seasonal allergies   . Hx: UTI (urinary tract infection)   . Carotid artery occlusion     Pt unaware  . History of kidney stones   . Hemorrhoids   . Cancer  ([place])     Melanoma- left cheek    Past Surgical History  Procedure Laterality Date  . Melanoma excision  2010    Left Cheek  . Cataract  2015    Both eyes  . Eye surgery    . Colonoscopy    . Scleral buckle Right 07/31/2014    Procedure: SCLERAL BUCKLE RIGHT EYE;  Surgeon: Hayden Pedro, MD;  Location: [facility];  Service: Ophthalmology;  Laterality: Right;  . Photocoagulation with laser Right 07/31/2014    Procedure: PHOTOCOAGULATION WITH LASER;  Surgeon: Hayden Pedro, MD;  Location: [place];  Service: Ophthalmology;  Laterality: Right;    Family History  Problem Relation Age of Onset  . Heart disease Father   . Alcohol abuse Father   . Bipolar disorder Father   . Dementia Mother   . Arthritis Mother   . Bipolar disorder Sister     father also   . Stroke Maternal Grandfather   . Heart disease Maternal Grandfather   . Lung cancer Paternal Grandmother     Social History   Social History  . Marital Status: Single    Spouse Name: N/A  . Number of Children: N/A  . Years of Education: N/A  Social History Main Topics  . Smoking status: Never Smoker   . Smokeless tobacco: Never Used  . Alcohol Use: 12.0 oz/week    0 Standard drinks or equivalent, 20 Cans of beer per week  . Drug Use: No  . Sexual Activity: No   Other Topics Concern  . 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    Drinks 6-8 beers on most days.  Believes she has an alcohol abuse problem.    Neg td    Originally [place]  [employer] and emplyed as Physiological scientist.     Outpatient Prescriptions Prior to Visit  Medication Sig Dispense Refill  . aspirin 325 MG tablet Take 325 mg by mouth daily.      Marland Kitchen diltiazem (CARDIZEM CD) 240 MG 24 hr capsule Take 1 capsule (240 mg total) by mouth daily. 30 capsule 11  . flecainide (TAMBOCOR) 50 MG tablet Take 2 tablets (100 mg  total) by mouth 2 (two) times daily. 120 tablet 5  . ibuprofen (ADVIL,MOTRIN) 200 MG tablet Take 200 mg by mouth every 6 (six) hours as needed for moderate pain.     . naproxen (NAPROSYN) 500 MG tablet Take 1 tablet (500 mg total) by mouth 2 (two) times daily with a meal. For up to 2 weeks and as needed 30 tablet 0  . rosuvastatin (CRESTOR) 10 MG tablet Take 1 tablet (10 mg total) by mouth daily. 15 tablet 3  . sertraline (ZOLOFT) 100 MG tablet TAKE (2) TABLETS DAILY. 180 tablet 0   No facility-administered medications prior to visit.     EXAM:  BP 130/80 mmHg  Temp(Src) 98.2 F (36.8 C) (Oral)  Ht '5\' 3"'$  (1.6 m)  Wt 155 lb (70.308 kg)  BMI 27.46 kg/m2  Body mass index is 27.46 kg/(m^2).  Physical Exam: Vital signs reviewed DTO:IZTI is a well-developed well-nourished alert cooperative    who appearsr stated age in no acute distress.  HEENT: normocephalic atraumatic , Eyes: PERRL EOM's full, conjunctiva clear, Nares: paten,t no deformity discharge or tenderness., Ears: no deformity EAC's clear TMs with normal landmarks. Mouth: clear OP, no lesions, edema.  Moist mucous membranes. Dentition in adequate repair. NECK: supple without masses, thyromegaly or bruits. CHEST/PULM:  Clear to auscultation and percussion breath sounds equal no wheeze , rales or rhonchi. No chest wall deformities or tenderness. CV: PMI is nondisplaced, S1 S2 no gallops, murmurs, rubs. Peripheral pulses are full without delay.No JVD .  ABDOMEN: Bowel sounds normal nontender  No guard or rebound, no hepato splenomegal no CVA tenderness.  No hernia. Extremtities:  No clubbing cyanosis or edema, no acute joint swelling or redness no focal atrophy  Right foot overriding toe and bunion  Corn no ulcers  Pulses intact  NEURO:  Oriented x3, cranial nerves 3-12 appear to be intact, no obvious focal weakness,gait within normal limits no abnormal reflexes or asymmetrical SKIN: No acute rashes normal turgor, color, no bruising or  petechiae.cat scratches on le  PSYCH: Oriented, good eye contact, no obvious depression anxiety, cognition and judgment appear normal. LN: no cervical axillary inguinal adenopathy  Lab Results  Component Value Date   WBC 8.7 10/16/2015   HGB 13.9 10/16/2015   HCT 40.7 10/16/2015   PLT 173.0 10/16/2015   GLUCOSE 111* 10/16/2015   CHOL 259* 10/16/2015  TRIG 106.0 10/16/2015   HDL 85.50 10/16/2015   LDLDIRECT 124.3 05/31/2008   LDLCALC 152* 10/16/2015   ALT 18 10/16/2015   AST 30 10/16/2015   NA 141 10/16/2015   K 4.0 10/16/2015   CL 105 10/16/2015   CREATININE 0.75 10/16/2015   BUN 14 10/16/2015   CO2 27 10/16/2015   TSH 1.83 10/16/2015   HGBA1C 5.0 10/23/2015    ASSESSMENT AND PLAN:  Discussed the following assessment and plan:  Visit for preventive health examination  Fasting hyperglycemia - a1c is excellent  poss fbs elevaetd from cretor but address lsi  etc  yearly lab check  - Plan: POC HgB A1c  Hyperlipidemia - on meds per cardiology   History of atrial fibrillation  Medication management - continue  sertraline  cont therapy   Need for shingles vaccine - Plan: Varicella-zoster vaccine subcutaneous  Patient Care Team: Burnis Medin, MD as PCP - General (Internal Medicine) Fay Records, MD as Consulting Physician (Cardiology) Alveda Reasons, PHD Monna Fam, MD as Consulting Physician (Ophthalmology) Danella Sensing, MD as Consulting Physician (Dermatology) Danella Sensing, MD as Consulting Physician (Dermatology) Patient Instructions  Get your mammogram .  [facility]   Screening .  Sugar is up some . Avoid simple carbs .    Processed  Sugars  Try a pedometer  Plan a dexa scan next year ( bone density)  ROV in 6 months med  . Check or as needed.      Health Maintenance, Female Adopting a healthy lifestyle and getting preventive care can go a long way to promote health and wellness. Talk with your health care provider about  what schedule of regular examinations is right for you. This is a good chance for you to check in with your provider about disease prevention and staying healthy. In between checkups, there are plenty of things you can do on your own. Experts have done a lot of research about which lifestyle changes and preventive measures are most likely to keep you healthy. Ask your health care provider for more information. Weight T AND DIET  Eat a healthy diet  Be sure to include plenty of vegetables, fruits, low-fat dairy products, and lean protein.  Do not eat a lot of foods high in solid fats, added sugars, or salt.  Get regular exercise. This is one of the most important things you can do for your health.  Most adults should exercise for at least 150 minutes each week. The exercise should increase your heart rate and make you sweat (moderate-intensity exercise).  Most adults should also do strengthening exercises at least twice a week. This is in addition to the moderate-intensity exercise.  Maintain a healthy weight  Body mass index (BMI) is a measurement that can be used to identify possible weight problems. It estimates body fat based on height and weight. Your health care provider can help determine your BMI and help you achieve or maintain a healthy weight.  For females 40 years of age and older:   A BMI below 18.5 is considered underweight.  A BMI of 18.5 to 24.9 is normal.  A BMI of 25 to 29.9 is considered overweight.  A BMI of 30 and above is considered obese.  Watch levels of cholesterol and blood lipids  You should start having your blood tested for lipids and cholesterol at 65 years of age, then have this test every 5 years.  You may need to have your cholesterol  levels checked more often if:  Your lipid or cholesterol levels are high.  You are older than 65 years of age.  You are at high risk for heart disease.  CANCER SCREENING   Lung Cancer  Lung cancer screening  is recommended for adults 40-104 years old who are at high risk for lung cancer because of a history of smoking.  A yearly low-dose CT scan of the lungs is recommended for people who:  Currently smoke.  Have quit within the past 15 years.  Have at least a 30-pack-year history of smoking. A pack year is smoking an average of one pack of cigarettes a day for 1 year.  Yearly screening should continue until it has been 15 years since you quit.  Yearly screening should stop if you develop a health problem that would prevent you from having lung cancer treatment.  Breast Cancer  Practice breast self-awareness. This means understanding how your breasts normally appear and feel.  It also means doing regular breast self-exams. Let your health care provider know about any changes, no matter how small.  If you are in your 20s or 30s, you should have a clinical breast exam (CBE) by a health care provider every 1-3 years as part of a regular health exam.  If you are 63 or older, have a CBE every year. Also consider having a breast X-ray (mammogram) every year.  If you have a family history of breast cancer, talk to your health care provider about genetic screening.  If you are at high risk for breast cancer, talk to your health care provider about having an MRI and a mammogram every year.  Breast cancer gene (BRCA) assessment is recommended for women who have family members with BRCA-related cancers. BRCA-related cancers include:  Breast.  Ovarian.  Tubal.  Peritoneal cancers.  Results of the assessment will determine the need for genetic counseling and BRCA1 and BRCA2 testing. Cervical Cancer Your health care provider may recommend that you be screened regularly for cancer of the pelvic organs (ovaries, uterus, and vagina). This screening involves a pelvic examination, including checking for microscopic changes to the surface of your cervix (Pap test). You may be encouraged to have this  screening done every 3 years, beginning at age 71.  For women ages 67-65, health care providers may recommend pelvic exams and Pap testing every 3 years, or they may recommend the Pap and pelvic exam, combined with testing for human papilloma virus (HPV), every 5 years. Some types of HPV increase your risk of cervical cancer. Testing for HPV may also be done on women of any age with unclear Pap test results.  Other health care providers may not recommend any screening for nonpregnant women who are considered low risk for pelvic cancer and who do not have symptoms. Ask your health care provider if a screening pelvic exam is right for you.  If you have had past treatment for cervical cancer or a condition that could lead to cancer, you need Pap tests and screening for cancer for at least 20 years after your treatment. If Pap tests have been discontinued, your risk factors (such as having a new sexual partner) need to be reassessed to determine if screening should resume. Some women have medical problems that increase the chance of getting cervical cancer. In these cases, your health care provider may recommend more frequent screening and Pap tests. Colorectal Cancer  This type of cancer can be detected and often prevented.  Routine colorectal cancer  screening usually begins at 65 years of age and continues through 65 years of age.  Your health care provider may recommend screening at an earlier age if you have risk factors for colon cancer.  Your health care provider may also recommend using home test kits to check for hidden blood in the stool.  A small camera at the end of a tube can be used to examine your colon directly (sigmoidoscopy or colonoscopy). This is done to check for the earliest forms of colorectal cancer.  Routine screening usually begins at age 17.  Direct examination of the colon should be repeated every 5-10 years through 65 years of age. However, you may need to be screened  more often if early forms of precancerous polyps or small growths are found. Skin Cancer  Check your skin from head to toe regularly.  Tell your health care provider about any new moles or changes in moles, especially if there is a change in a mole's shape or color.  Also tell your health care provider if you have a mole that is larger than the size of a pencil eraser.  Always use sunscreen. Apply sunscreen liberally and repeatedly throughout the day.  Protect yourself by wearing long sleeves, pants, a wide-brimmed hat, and sunglasses whenever you are outside. HEART DISEASE, DIABETES, AND HIGH BLOOD PRESSURE   High blood pressure causes heart disease and increases the risk of stroke. High blood pressure is more likely to develop in:  People who have blood pressure in the high end of the normal range (130-139/85-89 mm Hg).  People who are overweight or obese.  People who are African American.  If you are 86-38 years of age, have your blood pressure checked every 3-5 years. If you are 82 years of age or older, have your blood pressure checked every year. You should have your blood pressure measured twice--once when you are at a hospital or clinic, and once when you are not at a hospital or clinic. Record the average of the two measurements. To check your blood pressure when you are not at a hospital or clinic, you can use:  An automated blood pressure machine at a pharmacy.  A home blood pressure monitor.  If you are between 83 years and 4 years old, ask your health care provider if you should take aspirin to prevent strokes.  Have regular diabetes screenings. This involves taking a blood sample to check your fasting blood sugar level.  If you are at a normal weight and have a low risk for diabetes, have this test once every three years after 65 years of age.  If you are overweight and have a high risk for diabetes, consider being tested at a younger age or more often. PREVENTING  INFECTION  Hepatitis B  If you have a higher risk for hepatitis B, you should be screened for this virus. You are considered at high risk for hepatitis B if:  You were born in a country where hepatitis B is common. Ask your health care provider which countries are considered high risk.  Your parents were born in a high-risk country, and you have not been immunized against hepatitis B (hepatitis B vaccine).  You have HIV or AIDS.  You use needles to inject street drugs.  You live with someone who has hepatitis B.  You have had sex with someone who has hepatitis B.  You get hemodialysis treatment.  You take certain medicines for conditions, including cancer, organ transplantation, and autoimmune  conditions. Hepatitis C  Blood testing is recommended for:  Everyone born from [birth year] through 1965.  Anyone with known risk factors for hepatitis C. Sexually transmitted infections (STIs)  You should be screened for sexually transmitted infections (STIs) including gonorrhea and chlamydia if:  You are sexually active and are younger than 65 years of age.  You are older than 65 years of age and your health care provider tells you that you are at risk for this type of infection.  Your sexual activity has changed since you were last screened and you are at an increased risk for chlamydia or gonorrhea. Ask your health care provider if you are at risk.  If you do not have HIV, but are at risk, it may be recommended that you take a prescription medicine daily to prevent HIV infection. This is called pre-exposure prophylaxis (PrEP). You are considered at risk if:  You are sexually active and do not regularly use condoms or know the HIV status of your partner(s).  You take drugs by injection.  You are sexually active with a partner who has HIV. Talk with your health care provider about whether you are at high risk of being infected with HIV. If you choose to begin PrEP, you should first be  tested for HIV. You should then be tested every 3 months for as long as you are taking PrEP.  PREGNANCY   If you are premenopausal and you may become pregnant, ask your health care provider about preconception counseling.  If you may become pregnant, take 400 to 800 micrograms (mcg) of folic acid every day.  If you want to prevent pregnancy, talk to your health care provider about birth control (contraception). OSTEOPOROSIS AND MENOPAUSE   Osteoporosis is a disease in which the bones lose minerals and strength with aging. This can result in serious bone fractures. Your risk for osteoporosis can be identified using a bone density scan.  If you are 29 years of age or older, or if you are at risk for osteoporosis and fractures, ask your health care provider if you should be screened.  Ask your health care provider whether you should take a calcium or vitamin D supplement to lower your risk for osteoporosis.  Menopause may have certain physical symptoms and risks.  Hormone replacement therapy may reduce some of these symptoms and risks. Talk to your health care provider about whether hormone replacement therapy is right for you.  HOME CARE INSTRUCTIONS   Schedule regular health, dental, and eye exams.  Stay current with your immunizations.   Do not use any tobacco products including cigarettes, chewing tobacco, or electronic cigarettes.  If you are pregnant, do not drink alcohol.  If you are breastfeeding, limit how much and how often you drink alcohol.  Limit alcohol intake to no more than 1 drink per day for nonpregnant women. One drink equals 12 ounces of beer, 5 ounces of wine, or 1 ounces of hard liquor.  Do not use street drugs.  Do not share needles.  Ask your health care provider for help if you need support or information about quitting drugs.  Tell your health care provider if you often feel depressed.  Tell your health care provider if you have ever been abused or  do not feel safe at home.   This information is not intended to replace advice given to you by your health care provider. Make sure you discuss any questions you have with your health care provider.   Document  Released: 10/06/2010 Document Revised: 04/13/2014 Document Reviewed: 02/22/2013 Elsevier Interactive Patient Education 2016 Crenshaw K. Panosh M.D.

## 2015-10-23 ENCOUNTER — Ambulatory Visit (INDEPENDENT_AMBULATORY_CARE_PROVIDER_SITE_OTHER): Payer: BC Managed Care – PPO | Admitting: Internal Medicine

## 2015-10-23 ENCOUNTER — Encounter: Payer: Self-pay | Admitting: Internal Medicine

## 2015-10-23 VITALS — BP 130/80 | Temp 98.2°F | Ht 63.0 in | Wt 155.0 lb

## 2015-10-23 DIAGNOSIS — R7301 Impaired fasting glucose: Secondary | ICD-10-CM

## 2015-10-23 DIAGNOSIS — Z0000 Encounter for general adult medical examination without abnormal findings: Secondary | ICD-10-CM

## 2015-10-23 DIAGNOSIS — Z23 Encounter for immunization: Secondary | ICD-10-CM | POA: Diagnosis not present

## 2015-10-23 DIAGNOSIS — Z8679 Personal history of other diseases of the circulatory system: Secondary | ICD-10-CM

## 2015-10-23 DIAGNOSIS — E785 Hyperlipidemia, unspecified: Secondary | ICD-10-CM | POA: Diagnosis not present

## 2015-10-23 DIAGNOSIS — Z79899 Other long term (current) drug therapy: Secondary | ICD-10-CM

## 2015-10-23 LAB — POCT GLYCOSYLATED HEMOGLOBIN (HGB A1C): Hemoglobin A1C: 5

## 2015-10-23 NOTE — Progress Notes (Signed)
Pre visit review using our clinic review tool, if applicable. No additional management support is needed unless otherwise documented below in the visit note. 

## 2015-11-05 ENCOUNTER — Inpatient Hospital Stay (HOSPITAL_COMMUNITY)
Admission: EM | Admit: 2015-11-05 | Discharge: 2015-12-06 | DRG: 208 | Disposition: E | Payer: BC Managed Care – PPO | Attending: Emergency Medicine | Admitting: Emergency Medicine

## 2015-11-05 ENCOUNTER — Emergency Department (HOSPITAL_COMMUNITY): Payer: BC Managed Care – PPO

## 2015-11-05 ENCOUNTER — Encounter (HOSPITAL_COMMUNITY): Payer: Self-pay | Admitting: Emergency Medicine

## 2015-11-05 ENCOUNTER — Observation Stay (HOSPITAL_COMMUNITY): Payer: BC Managed Care – PPO

## 2015-11-05 ENCOUNTER — Telehealth: Payer: Self-pay | Admitting: Internal Medicine

## 2015-11-05 DIAGNOSIS — R569 Unspecified convulsions: Secondary | ICD-10-CM | POA: Diagnosis not present

## 2015-11-05 DIAGNOSIS — G935 Compression of brain: Secondary | ICD-10-CM | POA: Diagnosis not present

## 2015-11-05 DIAGNOSIS — R402214 Coma scale, best verbal response, none, 24 hours or more after hospital admission: Secondary | ICD-10-CM | POA: Diagnosis not present

## 2015-11-05 DIAGNOSIS — R402324 Coma scale, best motor response, extension, 24 hours or more after hospital admission: Secondary | ICD-10-CM | POA: Diagnosis not present

## 2015-11-05 DIAGNOSIS — Z882 Allergy status to sulfonamides status: Secondary | ICD-10-CM

## 2015-11-05 DIAGNOSIS — R40243 Glasgow coma scale score 3-8, unspecified time: Secondary | ICD-10-CM

## 2015-11-05 DIAGNOSIS — I609 Nontraumatic subarachnoid hemorrhage, unspecified: Secondary | ICD-10-CM

## 2015-11-05 DIAGNOSIS — R4189 Other symptoms and signs involving cognitive functions and awareness: Secondary | ICD-10-CM

## 2015-11-05 DIAGNOSIS — Z6828 Body mass index (BMI) 28.0-28.9, adult: Secondary | ICD-10-CM

## 2015-11-05 DIAGNOSIS — I480 Paroxysmal atrial fibrillation: Secondary | ICD-10-CM

## 2015-11-05 DIAGNOSIS — E1165 Type 2 diabetes mellitus with hyperglycemia: Secondary | ICD-10-CM | POA: Diagnosis present

## 2015-11-05 DIAGNOSIS — S065X9A Traumatic subdural hemorrhage with loss of consciousness of unspecified duration, initial encounter: Secondary | ICD-10-CM | POA: Diagnosis present

## 2015-11-05 DIAGNOSIS — I619 Nontraumatic intracerebral hemorrhage, unspecified: Secondary | ICD-10-CM | POA: Insufficient documentation

## 2015-11-05 DIAGNOSIS — Z811 Family history of alcohol abuse and dependence: Secondary | ICD-10-CM

## 2015-11-05 DIAGNOSIS — I2699 Other pulmonary embolism without acute cor pulmonale: Secondary | ICD-10-CM | POA: Diagnosis not present

## 2015-11-05 DIAGNOSIS — R402114 Coma scale, eyes open, never, 24 hours or more after hospital admission: Secondary | ICD-10-CM | POA: Diagnosis not present

## 2015-11-05 DIAGNOSIS — Z7982 Long term (current) use of aspirin: Secondary | ICD-10-CM

## 2015-11-05 DIAGNOSIS — I6340 Cerebral infarction due to embolism of unspecified cerebral artery: Secondary | ICD-10-CM | POA: Insufficient documentation

## 2015-11-05 DIAGNOSIS — Z85820 Personal history of malignant melanoma of skin: Secondary | ICD-10-CM

## 2015-11-05 DIAGNOSIS — I613 Nontraumatic intracerebral hemorrhage in brain stem: Secondary | ICD-10-CM | POA: Diagnosis not present

## 2015-11-05 DIAGNOSIS — E7800 Pure hypercholesterolemia, unspecified: Secondary | ICD-10-CM | POA: Diagnosis present

## 2015-11-05 DIAGNOSIS — E669 Obesity, unspecified: Secondary | ICD-10-CM | POA: Diagnosis present

## 2015-11-05 DIAGNOSIS — Z86711 Personal history of pulmonary embolism: Secondary | ICD-10-CM

## 2015-11-05 DIAGNOSIS — J690 Pneumonitis due to inhalation of food and vomit: Secondary | ICD-10-CM | POA: Diagnosis not present

## 2015-11-05 DIAGNOSIS — Z66 Do not resuscitate: Secondary | ICD-10-CM | POA: Diagnosis not present

## 2015-11-05 DIAGNOSIS — I4891 Unspecified atrial fibrillation: Secondary | ICD-10-CM | POA: Diagnosis present

## 2015-11-05 DIAGNOSIS — I2510 Atherosclerotic heart disease of native coronary artery without angina pectoris: Secondary | ICD-10-CM | POA: Diagnosis present

## 2015-11-05 DIAGNOSIS — G936 Cerebral edema: Secondary | ICD-10-CM | POA: Diagnosis not present

## 2015-11-05 DIAGNOSIS — R55 Syncope and collapse: Secondary | ICD-10-CM | POA: Diagnosis present

## 2015-11-05 DIAGNOSIS — R4020 Unspecified coma: Secondary | ICD-10-CM | POA: Diagnosis not present

## 2015-11-05 DIAGNOSIS — I469 Cardiac arrest, cause unspecified: Secondary | ICD-10-CM | POA: Diagnosis not present

## 2015-11-05 DIAGNOSIS — R579 Shock, unspecified: Secondary | ICD-10-CM | POA: Diagnosis not present

## 2015-11-05 DIAGNOSIS — J9601 Acute respiratory failure with hypoxia: Secondary | ICD-10-CM | POA: Diagnosis not present

## 2015-11-05 DIAGNOSIS — Z0189 Encounter for other specified special examinations: Secondary | ICD-10-CM

## 2015-11-05 DIAGNOSIS — F1010 Alcohol abuse, uncomplicated: Secondary | ICD-10-CM | POA: Diagnosis not present

## 2015-11-05 DIAGNOSIS — E785 Hyperlipidemia, unspecified: Secondary | ICD-10-CM | POA: Diagnosis present

## 2015-11-05 DIAGNOSIS — Z881 Allergy status to other antibiotic agents status: Secondary | ICD-10-CM

## 2015-11-05 DIAGNOSIS — F22 Delusional disorders: Secondary | ICD-10-CM

## 2015-11-05 DIAGNOSIS — G9340 Encephalopathy, unspecified: Secondary | ICD-10-CM

## 2015-11-05 DIAGNOSIS — Z823 Family history of stroke: Secondary | ICD-10-CM

## 2015-11-05 HISTORY — DX: Unspecified convulsions: R56.9

## 2015-11-05 HISTORY — DX: Type 2 diabetes mellitus without complications: E11.9

## 2015-11-05 HISTORY — DX: Unspecified osteoarthritis, unspecified site: M19.90

## 2015-11-05 HISTORY — DX: Syncope and collapse: R55

## 2015-11-05 HISTORY — DX: Malignant melanoma of other parts of face: C43.39

## 2015-11-05 HISTORY — DX: Angina pectoris, unspecified: I20.9

## 2015-11-05 HISTORY — DX: Anxiety disorder, unspecified: F41.9

## 2015-11-05 HISTORY — DX: Other pulmonary embolism without acute cor pulmonale: I26.99

## 2015-11-05 LAB — I-STAT TROPONIN, ED: Troponin i, poc: 0 ng/mL (ref 0.00–0.08)

## 2015-11-05 LAB — COMPREHENSIVE METABOLIC PANEL
ALK PHOS: 70 U/L (ref 38–126)
ALT: 22 U/L (ref 14–54)
AST: 41 U/L (ref 15–41)
Albumin: 4 g/dL (ref 3.5–5.0)
Anion gap: 6 (ref 5–15)
BILIRUBIN TOTAL: 0.8 mg/dL (ref 0.3–1.2)
BUN: 13 mg/dL (ref 6–20)
CALCIUM: 9.7 mg/dL (ref 8.9–10.3)
CO2: 25 mmol/L (ref 22–32)
CREATININE: 1.04 mg/dL — AB (ref 0.44–1.00)
Chloride: 109 mmol/L (ref 101–111)
GFR calc non Af Amer: 56 mL/min — ABNORMAL LOW (ref >60)
Glucose, Bld: 131 mg/dL — ABNORMAL HIGH (ref 65–99)
Potassium: 4.4 mmol/L (ref 3.5–5.1)
SODIUM: 140 mmol/L (ref 135–145)
TOTAL PROTEIN: 6.8 g/dL (ref 6.5–8.1)

## 2015-11-05 LAB — URINALYSIS, ROUTINE W REFLEX MICROSCOPIC
BILIRUBIN URINE: NEGATIVE
Glucose, UA: NEGATIVE mg/dL
Ketones, ur: 15 mg/dL — AB
NITRITE: NEGATIVE
PROTEIN: NEGATIVE mg/dL
Specific Gravity, Urine: 1.022 (ref 1.005–1.030)
pH: 5.5 (ref 5.0–8.0)

## 2015-11-05 LAB — URINE MICROSCOPIC-ADD ON

## 2015-11-05 LAB — CBC WITH DIFFERENTIAL/PLATELET
Basophils Absolute: 0 10*3/uL (ref 0.0–0.1)
Basophils Relative: 0 %
EOS ABS: 0 10*3/uL (ref 0.0–0.7)
Eosinophils Relative: 0 %
HEMATOCRIT: 41.8 % (ref 36.0–46.0)
HEMOGLOBIN: 13.9 g/dL (ref 12.0–15.0)
LYMPHS ABS: 0.7 10*3/uL (ref 0.7–4.0)
LYMPHS PCT: 6 %
MCH: 33.4 pg (ref 26.0–34.0)
MCHC: 33.3 g/dL (ref 30.0–36.0)
MCV: 100.5 fL — AB (ref 78.0–100.0)
MONOS PCT: 5 %
Monocytes Absolute: 0.5 10*3/uL (ref 0.1–1.0)
NEUTROS ABS: 10.1 10*3/uL — AB (ref 1.7–7.7)
NEUTROS PCT: 89 %
Platelets: 169 10*3/uL (ref 150–400)
RBC: 4.16 MIL/uL (ref 3.87–5.11)
RDW: 12.2 % (ref 11.5–15.5)
WBC: 11.3 10*3/uL — AB (ref 4.0–10.5)

## 2015-11-05 LAB — ETHANOL: Alcohol, Ethyl (B): <5 mg/dL (ref <5)

## 2015-11-05 LAB — ACETAMINOPHEN LEVEL

## 2015-11-05 LAB — TSH: TSH: 1.116 u[IU]/mL (ref 0.350–4.500)

## 2015-11-05 LAB — RAPID URINE DRUG SCREEN, HOSP PERFORMED
AMPHETAMINES: NOT DETECTED
BARBITURATES: NOT DETECTED
BENZODIAZEPINES: NOT DETECTED
Cocaine: NOT DETECTED
Opiates: NOT DETECTED
Tetrahydrocannabinol: NOT DETECTED

## 2015-11-05 LAB — D-DIMER, QUANTITATIVE (NOT AT ARMC): D DIMER QUANT: 1.8 ug{FEU}/mL — AB (ref 0.00–0.50)

## 2015-11-05 LAB — SALICYLATE LEVEL: Salicylate Lvl: <4 mg/dL (ref 2.8–30.0)

## 2015-11-05 LAB — BRAIN NATRIURETIC PEPTIDE: B NATRIURETIC PEPTIDE 5: 154.5 pg/mL — AB (ref 0.0–100.0)

## 2015-11-05 LAB — HEPARIN LEVEL (UNFRACTIONATED): HEPARIN UNFRACTIONATED: 0.52 [IU]/mL (ref 0.30–0.70)

## 2015-11-05 MED ORDER — HEPARIN BOLUS VIA INFUSION
4000.0000 [IU] | Freq: Once | INTRAVENOUS | Status: AC
  Administered 2015-11-05: 4000 [IU] via INTRAVENOUS
  Filled 2015-11-05: qty 4000

## 2015-11-05 MED ORDER — HEPARIN BOLUS VIA INFUSION: 4000.0000 [IU] | Freq: Once | INTRAVENOUS | Status: DC

## 2015-11-05 MED ORDER — ONDANSETRON HCL 4 MG/2ML IJ SOLN: 4.0000 mg | Freq: Four times a day (QID) | INTRAMUSCULAR | Status: DC | PRN

## 2015-11-05 MED ORDER — ASPIRIN 325 MG PO TABS
325.0000 mg | ORAL_TABLET | Freq: Every day | ORAL | Status: DC
  Administered 2015-11-06: 325 mg via ORAL
  Filled 2015-11-05: qty 1

## 2015-11-05 MED ORDER — SODIUM CHLORIDE 0.9% FLUSH
3.0000 mL | Freq: Two times a day (BID) | INTRAVENOUS | Status: DC
  Administered 2015-11-05 – 2015-11-06 (×3): 3 mL via INTRAVENOUS

## 2015-11-05 MED ORDER — HEPARIN (PORCINE) IN NACL 100-0.45 UNIT/ML-% IJ SOLN
1050.0000 [IU]/h | INTRAMUSCULAR | Status: DC
  Administered 2015-11-05 – 2015-11-06 (×2): 1050 [IU]/h via INTRAVENOUS
  Filled 2015-11-05 (×4): qty 250

## 2015-11-05 MED ORDER — THIAMINE HCL 100 MG/ML IJ SOLN: 100.0000 mg | Freq: Every day | INTRAMUSCULAR | Status: DC

## 2015-11-05 MED ORDER — LORAZEPAM 1 MG PO TABS
1.0000 mg | ORAL_TABLET | Freq: Four times a day (QID) | ORAL | Status: DC | PRN
Start: 2015-11-05 — End: 2015-11-07

## 2015-11-05 MED ORDER — SERTRALINE HCL 100 MG PO TABS
200.0000 mg | ORAL_TABLET | Freq: Every day | ORAL | Status: DC
  Administered 2015-11-06: 200 mg via ORAL
  Filled 2015-11-05: qty 2

## 2015-11-05 MED ORDER — FLECAINIDE ACETATE 100 MG PO TABS
100.0000 mg | ORAL_TABLET | Freq: Two times a day (BID) | ORAL | Status: DC
  Administered 2015-11-05 – 2015-11-06 (×2): 100 mg via ORAL
  Filled 2015-11-05 (×3): qty 1

## 2015-11-05 MED ORDER — DILTIAZEM HCL ER COATED BEADS 240 MG PO CP24
240.0000 mg | ORAL_CAPSULE | Freq: Every day | ORAL | Status: DC
  Administered 2015-11-06: 240 mg via ORAL
  Filled 2015-11-05: qty 1

## 2015-11-05 MED ORDER — NAPROXEN 500 MG PO TABS
500.0000 mg | ORAL_TABLET | Freq: Two times a day (BID) | ORAL | Status: DC
  Administered 2015-11-05 – 2015-11-06 (×3): 500 mg via ORAL
  Filled 2015-11-05: qty 2
  Filled 2015-11-05: qty 1
  Filled 2015-11-05 (×2): qty 2

## 2015-11-05 MED ORDER — VITAMIN B-1 100 MG PO TABS
100.0000 mg | ORAL_TABLET | Freq: Every day | ORAL | Status: DC
  Administered 2015-11-05 – 2015-11-06 (×2): 100 mg via ORAL
  Filled 2015-11-05 (×2): qty 1

## 2015-11-05 MED ORDER — ONDANSETRON HCL 4 MG PO TABS: 4.0000 mg | ORAL_TABLET | Freq: Four times a day (QID) | ORAL | Status: DC | PRN

## 2015-11-05 MED ORDER — ADULT MULTIVITAMIN W/MINERALS CH
1.0000 | ORAL_TABLET | Freq: Every day | ORAL | Status: DC
  Administered 2015-11-05 – 2015-11-06 (×2): 1 via ORAL
  Filled 2015-11-05 (×2): qty 1

## 2015-11-05 MED ORDER — SODIUM CHLORIDE 0.9 % IV SOLN
INTRAVENOUS | Status: AC
  Administered 2015-11-05: 16:00:00 via INTRAVENOUS

## 2015-11-05 MED ORDER — IOPAMIDOL (ISOVUE-370) INJECTION 76%
INTRAVENOUS | Status: AC
  Administered 2015-11-05: 100 mL
  Filled 2015-11-05: qty 100

## 2015-11-05 MED ORDER — SERTRALINE HCL 100 MG PO TABS: 200.0000 mg | ORAL_TABLET | Freq: Every day | ORAL | Status: DC

## 2015-11-05 MED ORDER — LORAZEPAM 2 MG/ML IJ SOLN: 1.0000 mg | INTRAMUSCULAR | Status: DC | PRN

## 2015-11-05 MED ORDER — ASPIRIN 325 MG PO TABS: 325.0000 mg | ORAL_TABLET | Freq: Every day | ORAL | Status: DC

## 2015-11-05 MED ORDER — LORAZEPAM 2 MG/ML IJ SOLN: 1.0000 mg | Freq: Four times a day (QID) | INTRAMUSCULAR | Status: DC | PRN

## 2015-11-05 MED ORDER — IBUPROFEN 200 MG PO TABS
200.0000 mg | ORAL_TABLET | Freq: Four times a day (QID) | ORAL | Status: DC | PRN
  Administered 2015-11-06: 200 mg via ORAL
  Filled 2015-11-05: qty 1

## 2015-11-05 MED ORDER — ACETAMINOPHEN 325 MG PO TABS: 650.0000 mg | ORAL_TABLET | Freq: Four times a day (QID) | ORAL | Status: DC | PRN

## 2015-11-05 MED ORDER — ALUM & MAG HYDROXIDE-SIMETH 200-200-20 MG/5ML PO SUSP
30.0000 mL | Freq: Four times a day (QID) | ORAL | Status: DC | PRN
  Filled 2015-11-05: qty 30

## 2015-11-05 MED ORDER — ACETAMINOPHEN 650 MG RE SUPP: 650.0000 mg | Freq: Four times a day (QID) | RECTAL | Status: DC | PRN

## 2015-11-05 MED ORDER — ROSUVASTATIN CALCIUM 10 MG PO TABS
10.0000 mg | ORAL_TABLET | Freq: Every day | ORAL | Status: DC
  Administered 2015-11-06: 10 mg via ORAL
  Filled 2015-11-05: qty 1

## 2015-11-05 MED ORDER — FOLIC ACID 1 MG PO TABS
1.0000 mg | ORAL_TABLET | Freq: Every day | ORAL | Status: DC
  Administered 2015-11-05 – 2015-11-06 (×2): 1 mg via ORAL
  Filled 2015-11-05 (×2): qty 1

## 2015-11-05 NOTE — Progress Notes (Signed)
ANTICOAGULATION CONSULT NOTE - Initial Consult  Pharmacy Consult for heparin Indication: pulmonary embolus  Allergies  Allergen Reactions  . Cephalosporins Rash  . Sulfonamide Derivatives Rash    Patient Measurements:   Heparin Dosing Weight: 70kg  Vital Signs: Temp: 98.2 F (36.8 C) (08/01 1802) Temp Source: Oral (08/01 1802) BP: 103/79 (08/01 1802) Pulse Rate: 83 (08/01 1802)  Labs:  Recent Labs  11/19/2015 1002 11/23/2015 1933  HGB 13.9  --   HCT 41.8  --   PLT 169  --   HEPARINUNFRC  --  0.52  CREATININE 1.04*  --     Estimated Creatinine Clearance: 51.4 mL/min (by C-G formula based on SCr of 1.04 mg/dL).  Assessment: [name] with history of AFib not on anticoagulants PTA (on ASA 325mg ) who presented to [facility] ED today after witnessed seizure and LOC. DDimer elevated and CTA showed PE. Baseline Hgb 13.9, plts 169- no overt bleeding noted at baseline.  CT head done 11/29/2015: There is no evidence of acute cortical infarct, intracranial hemorrhage.   Initial 6 hour heparin level is 0.52 on IV heparin 1050 units/hr IV.  HL is therapeutic. No bleeding noted.  Goal of Therapy:  Heparin level 0.3-0.7 units/ml Monitor platelets by anticoagulation protocol: Yes   Plan:  -Continue IV heparin rate at 1050 units/hr -heparin level in 6h -daily HL and CBC -follow for s/s bleeding and long term [facility] plans  [name], RPh Clinical Pharmacist Pager: [phone] 12/04/2015 8:30 PM

## 2015-11-05 NOTE — Procedures (Signed)
ELECTROENCEPHALOGRAM REPORT  Date of Study: 11/09/2015  Patient's Name: [name] MRN: [id number] Date of Birth: 02-20-1951  Referring Provider: Dyanne Carrel, NP  Clinical History: This is a 65 year old woman with a witnessed seizure with loss of consciousness.  Medications: acetaminophen (TYLENOL) tablet 650 mg  aspirin tablet 325 mg  diltiazem (CARDIZEM CD) 24 hr capsule 240 mg  flecainide (TAMBOCOR) tablet 123XX123 mg  folic acid (FOLVITE) tablet 1 mg  rosuvastatin (CRESTOR) tablet 10 mg  sertraline (ZOLOFT) tablet 200 mg  thiamine (VITAMIN B-1) tablet 100 mg   Technical Summary: A multichannel digital EEG recording measured by the international 10-20 system with electrodes applied with paste and impedances below 5000 ohms performed in our laboratory with EKG monitoring in an awake and drowsy patient.  Hyperventilation and photic stimulation were not performed.  The digital EEG was referentially recorded, reformatted, and digitally filtered in a variety of bipolar and referential montages for optimal display.    Description: The patient is awake and drowsy during the recording.  During maximal wakefulness, there is a symmetric, medium voltage 11 Hz posterior dominant rhythm that attenuates with eye opening.  The record is symmetric.  During drowsiness, there is an increase in theta slowing of the background.  Deeper stages of sleep were not seen.  Hyperventilation and photic stimulation were not performed  There were no epileptiform discharges or electrographic seizures seen.    EKG lead was unremarkable.  Impression: This awake and drowsy EEG is normal.    Clinical Correlation: A normal EEG does not exclude a clinical diagnosis of epilepsy. Clinical correlation is advised.   Ellouise Newer, M.D.

## 2015-11-05 NOTE — Telephone Encounter (Signed)
Noted.  Will inform Dr. Harrington Challenger upon her return to clinic.

## 2015-11-05 NOTE — Telephone Encounter (Signed)
New message    FYI  Pt is in the hospital due to having a blood clot on her left lung and passed out at work. Will be there for 2 days.

## 2015-11-05 NOTE — Progress Notes (Signed)
Bedside EEG completed in ED.  Results pending.

## 2015-11-05 NOTE — ED Provider Notes (Signed)
[facility] DEPT Provider Note   CSN: [id number] Arrival date & time: 12/03/2015  [id number]  First Provider Contact:  First MD Initiated Contact with Patient 11/24/2015 [phone]        History   Chief Complaint Chief Complaint  Patient presents with  . Seizures    HPI [name] is a 65 y.o. female.  Patient is a 65 year old female with past medical history of alcohol abuse, paroxysmal A. Fib (on Flecainide) who presents the ED via EMS status post LOC, onset prior to arrival. Patient reports she was sitting at her desk at work when the next thing she remembers is waking up laying on the EMS stretcher. Patient reports her coworkers stated they found her laying on the floor next to her desk unconscious. Witnesses reported to EMS that patient was seizing for approximately 2 minutes. Patient denies history of seizures or syncopal episodes. She denies any symptoms prior to episode and currently denies any pain or symptoms upon arrival to the ED. Patient denies any drug use but endorses drinking 2-3 beers at night. Denies initiation of any new medications. Pt denies fever, HA, lightheadedness, dizziness, visual changes, neck stiffness, photophobia, CP, SOB, CP, wheezing, abdominal pain, N/V, urinary symptoms, numbness, tingling, weakness.  Denies use of anticoagulants.    Seizures      Past Medical History:  Diagnosis Date  . Arrhythmia    PAF ON FLECAINIDE  . Atrial septal aneurysm    on aspirin  . Cancer ([place])    Melanoma- left cheek  . Carotid artery occlusion    Pt unaware  . Depression   . ETOH abuse   . Hemorrhoids   . History of kidney stones   . History of vertigo   . Hx of varicella   . Hx: UTI (urinary tract infection)   . Hyperlipidemia   . Seasonal allergies   . Seizure ([place])   . Syncope     Patient Active Problem List   Diagnosis Date Noted  . Seizure ([place]) 11/11/2015  . Syncope 11/20/2015  . Pulmonary embolism ([place]) 11/26/2015  . 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 Depression 06/14/2014  . Foot deformity, acquired 06/14/2014  . Hyperlipidemia 06/14/2014  . Atrial fibrillation ([place]) 11/28/2009  . HYPERCHOLESTEROLEMIA 03/20/2009  . ALCOHOL ABUSE 03/20/2009  . DEPRESSION, HX OF 03/20/2009  . VERTIGO, HX OF 03/20/2009  . History of atrial fibrillation 03/20/2009    Past Surgical History:  Procedure Laterality Date  . Cataract  2015   Both eyes  . COLONOSCOPY    . EYE SURGERY    . MELANOMA EXCISION  2010   Left Cheek  . PHOTOCOAGULATION WITH LASER Right 07/31/2014   Procedure: PHOTOCOAGULATION WITH LASER;  Surgeon: Hayden Pedro, MD;  Location: [facility];  Service: Ophthalmology;  Laterality: Right;  . SCLERAL BUCKLE Right 07/31/2014   Procedure: SCLERAL BUCKLE RIGHT EYE;  Surgeon: Hayden Pedro, MD;  Location: [facility];  Service: Ophthalmology;  Laterality: Right;    OB History    Gravida Para Term Preterm AB Living   0 0 0 0 0 0   SAB TAB Ectopic Multiple Live Births   0 0 0 0         Home Medications    Prior to Admission medications   Medication Sig Start Date End Date Taking? Authorizing Provider  aspirin 325 MG tablet Take 325 mg by mouth daily.  Yes Historical Provider, MD  diltiazem (CARDIZEM CD) 240 MG 24 hr capsule Take 1 capsule (240 mg total) by mouth daily. 01/18/15  Yes Fay Records, MD  flecainide (TAMBOCOR) 50 MG tablet Take 2 tablets (100 mg total) by mouth 2 (two) times daily. 07/01/15  Yes Fay Records, MD  ibuprofen (ADVIL,MOTRIN) 200 MG tablet Take 200 mg by mouth every 6 (six) hours as needed for moderate pain.    Yes Historical Provider, MD  naproxen (NAPROSYN) 500 MG tablet Take 1 tablet (500 mg total) by mouth 2 (two) times daily with a meal. For up to 2 weeks and as needed 05/06/15  Yes Burnis Medin, MD  rosuvastatin (CRESTOR) 10 MG tablet Take 1 tablet (10 mg total) by mouth daily. 08/07/15  Yes Fay Records, MD  sertraline (ZOLOFT) 100 MG tablet TAKE (2) TABLETS DAILY. 09/24/15  Yes Burnis Medin, MD    Family History Family History  Problem Relation Age of Onset  . Heart disease Father   . Alcohol abuse Father   . Bipolar disorder Father   . Dementia Mother   . Arthritis Mother   . Stroke Maternal Grandfather   . Heart disease Maternal Grandfather   . Lung cancer Paternal Grandmother   . Bipolar disorder Sister     father also     Social History Social History  Substance Use Topics  . Smoking status: Never Smoker  . Smokeless tobacco: Never Used  . Alcohol use 12.0 oz/week    20 Cans of beer per week     Allergies   Cephalosporins and Sulfonamide derivatives   Review of Systems Review of Systems  Neurological: Positive for seizures and syncope.  All other systems reviewed and are negative.    Physical Exam Updated Vital Signs BP 129/67 (BP Location: Right Arm)   Pulse 86   Resp 24   SpO2 98%   Physical Exam  Constitutional: She is oriented to person, place, and time. She appears well-developed and well-nourished.  HENT:  Head: Normocephalic. Head is without raccoon's eyes, without Battle's sign, without abrasion, without contusion and without laceration.  Right Ear: Tympanic membrane normal. No hemotympanum.  Left Ear: Tympanic membrane normal. No hemotympanum.  Nose: Nose normal. No sinus tenderness, nasal deformity, septal deviation or nasal septal hematoma. No epistaxis.  Mouth/Throat: Uvula is midline, oropharynx is clear and moist and mucous membranes are normal. No oropharyngeal exudate, posterior oropharyngeal edema, posterior oropharyngeal erythema or tonsillar abscesses.  Bite marks noted to bilateral sides of tongue, no active bleeding  Eyes: Conjunctivae and EOM are normal. Pupils are equal, round, and reactive to light. Right eye exhibits no discharge. Left eye exhibits no discharge. No scleral icterus.  Neck: Normal range of motion. Neck  supple.  Cardiovascular: Normal rate, regular rhythm, normal heart sounds and intact distal pulses.   Pulmonary/Chest: Effort normal and breath sounds normal. No respiratory distress. She has no wheezes. She has no rales. She exhibits no tenderness.  Abdominal: Soft. Bowel sounds are normal. She exhibits no distension and no mass. There is no tenderness. There is no rebound and no guarding. No hernia.  Musculoskeletal: Normal range of motion. She exhibits no edema.  No cervical, thoracic, or lumbar spine midline TTP.  Full ROM of bilateral upper and lower extremities with 5/5 strength.   2+ radial and PT pulses. Sensation grossly intact.   Neurological: She is alert and oriented to person, place, and time. She has normal strength. No cranial  nerve deficit or sensory deficit. Coordination and gait normal.  Normal finger-nose-finger  Skin: Skin is warm and dry. Capillary refill takes less than 2 seconds.  Nursing note and vitals reviewed.    ED Treatments / Results  Labs (all labs ordered are listed, but only abnormal results are displayed) Labs Reviewed  CBC WITH DIFFERENTIAL/PLATELET - Abnormal; Notable for the following:       Result Value   WBC 11.3 (*)    MCV 100.5 (*)    Neutro Abs 10.1 (*)    All other components within normal limits  COMPREHENSIVE METABOLIC PANEL - Abnormal; Notable for the following:    Glucose, Bld 131 (*)    Creatinine, Ser 1.04 (*)    GFR calc non Af Amer 56 (*)    All other components within normal limits  ACETAMINOPHEN LEVEL - Abnormal; Notable for the following:    Acetaminophen (Tylenol), Serum <10 (*)    All other components within normal limits  URINALYSIS, ROUTINE W REFLEX MICROSCOPIC (NOT AT [facility]) - Abnormal; Notable for the following:    APPearance CLOUDY (*)    Hgb urine dipstick MODERATE (*)    Ketones, ur 15 (*)    Leukocytes, UA MODERATE (*)    All other components within normal limits  D-DIMER, QUANTITATIVE (NOT AT [facility]) - Abnormal;  Notable for the following:    D-Dimer, Quant 1.80 (*)    All other components within normal limits  URINE MICROSCOPIC-ADD ON - Abnormal; Notable for the following:    Squamous Epithelial / LPF 6-30 (*)    Bacteria, UA FEW (*)    All other components within normal limits  ETHANOL  SALICYLATE LEVEL  URINE RAPID DRUG SCREEN, HOSP PERFORMED  BRAIN NATRIURETIC PEPTIDE  HEPARIN LEVEL (UNFRACTIONATED)  I-STAT TROPOININ, ED    EKG  EKG Interpretation  Date/Time:  Tuesday November 05 2015 09:08:40 EDT Ventricular Rate:  97 PR Interval:    QRS Duration: 86 QT Interval:  334 QTC Calculation: 425 R Axis:   139 Text Interpretation:  Sinus rhythm Atrial premature complex Right axis deviation Borderline repolarization abnormality prolonged PR s Confirmed by Gerald Leitz ([id number]) on 12/02/2015 9:35:58 AM       Radiology Ct Head Wo Contrast  Result Date: 11/24/2015 CLINICAL DATA:  Loss of consciousness. Syncopal episode/seizure while sitting at desk at work. EXAM: CT HEAD WITHOUT CONTRAST TECHNIQUE: Contiguous axial images were obtained from the base of the skull through the vertex without intravenous contrast. COMPARISON:  None. FINDINGS: There is no evidence of acute cortical infarct, intracranial hemorrhage, mass, midline shift, or extra-axial fluid collection. Ventricles and sulci are normal. Cerebral white matter hypodensities are nonspecific but compatible with mild chronic small vessel ischemic disease. Bilateral cataract extraction in right scleral buckle are noted. There is right parietal scalp swelling. The visualized paranasal sinuses and mastoid air cells are clear. No skull fracture is identified. IMPRESSION: 1. No evidence of acute intracranial abnormality. 2. Right parietal scalp swelling. 3. Mild chronic small vessel ischemic disease. Electronically Signed   By: Logan Bores M.D.   On: 11/27/2015 10:33   Ct Angio Chest Pe W And/or Wo Contrast  Result Date: 11/12/2015 CLINICAL DATA:   Loss of consciousness.  Syncope versus seizure. EXAM: CT ANGIOGRAPHY CHEST WITH CONTRAST TECHNIQUE: Multidetector CT imaging of the chest was performed using the standard protocol during bolus administration of intravenous contrast. Multiplanar CT image reconstructions and MIPs were obtained to evaluate the vascular anatomy. CONTRAST:  100 mL Isovue 370  COMPARISON:  Chest radiograph 05/22/2004 FINDINGS: Mediastinum/Lymph Nodes: Pulmonary arterial opacification is adequate, however there is mild motion artifact diffusely which limits subsegmental evaluation. There is a single apparent filling defect involving a distal segmental artery at its branch point in the anterior basal right lower lobe (series 7, image 117). No other definite emboli are identified, and there is no central embolus. No masses or pathologically enlarged lymph nodes identified. Lungs/Pleura: Evaluation of the lung parenchyma is mildly limited by respiratory motion artifact. There is mild dependent atelectasis in the lung bases. No lung mass or pleural effusion. Major airways are patent. Upper abdomen: 7 mm hypodensity in the left hepatic lobe, likely a cyst. Small sliding hiatal hernia. Musculoskeletal: Old lateral right ninth and tenth rib fractures. Moderate thoracic dextroscoliosis with mild multilevel disc degeneration. Review of the MIP images confirms the above findings. IMPRESSION: Suspected single distal segmental pulmonary embolus in the right lower lobe. No central emboli. These results were called by telephone at the time of interpretation on 11/06/2015 at 12:29 pm to Dr. Justine Null, who verbally acknowledged these results. Electronically Signed   By: Logan Bores M.D.   On: 11/21/2015 12:30    Procedures Procedures (including critical care time)  Medications Ordered in ED Medications  heparin bolus via infusion 4,000 Units (not administered)  heparin ADULT infusion 100 units/mL (25000 units/257mL sodium chloride 0.45%) (not  administered)  iopamidol (ISOVUE-370) 76 % injection (100 mLs  Contrast Given 11/25/2015 1119)     Initial Impression / Assessment and Plan / ED Course  I have reviewed the triage vital signs and the nursing notes.  Pertinent labs & imaging results that were available during my care of the patient were reviewed by me and considered in my medical decision making (see chart for details).  Clinical Course   Patient presents with reported episode of witnessed seizure with associated loss of consciousness. No prior history of seizures. Patient reports history of abdominal A. fib. Patient denies any pain or complaints at this time. VSS. Exam revealed small bite marks to lateral sides of tongue, no active bleeding. No other signs of head injury or trauma. Neuro neuro deficits. Remaining exam unremarkable. EKG showed sinus rhythm with prolonged PR, troponin negative. D-dimer 1.8. Will order CT chest angiogram for further evaluation of PE. CT head revealed right parietal scalp swelling, no other acute abnormality. CT angiogram revealed single distal segmental PE and right lower lobe. Patient started on heparin in the ED. Consultation hospitalist for admission. Discussed results and plan for admission with patient.   Final Clinical Impressions(s) / ED Diagnoses   Final diagnoses:  None    New Prescriptions New Prescriptions   No medications on file     Nona Dell, PA-C 11/23/2015 [place], MD 11/20/2015 1313

## 2015-11-05 NOTE — Care Management Note (Signed)
Case Management Note  Patient Details  Name: [name] MRN: [id number] Date of Birth: 01/21/1951  Subjective/Objective:                   65 year old female with past medical history of alcohol abuse, paroxysmal A. Fib (on Flecainide) who presents the ED via EMS status post LOC, onset prior to arrival. / Home alone.  Action/Plan: Follow for disposition needs.   Expected Discharge Date:  Nov 28, 2015               Expected Discharge Plan:  Home/Self Care  In-House Referral:  NA  Discharge planning Services  CM Consult  Post Acute Care Choice:    Choice offered to:     DME Arranged:    DME Agency:     HH Arranged:    HH Agency:     Status of Service:  In process, will continue to follow  If discussed at Long Length of Stay Meetings, dates discussed:    Additional Comments:  Fuller Mandril, RN 11/23/2015, 1:54 PM

## 2015-11-05 NOTE — ED Notes (Addendum)
Patient alert and oriented.  Complains of no pain.  Bilateral forearm IV's.  Small contusion to posterior right of head from fall. EEG at the bedside perform test, patient will be ready to transport after EEG completed.

## 2015-11-05 NOTE — ED Notes (Signed)
Attempted to obtain urine specimen; Pt unable to provide one at this time 

## 2015-11-05 NOTE — H&P (Signed)
History and Physical    [name] [id number] DOB: 02-19-1951 DOA: 11/16/2015  PCP: Lottie Dawson, MD Patient coming from: work  Chief Complaint: seizure/syncope  HPI: [name] is a very pleasant 65 y.o. female with medical history significant for EtOH abuse, A. fib on Cardizem and aspirin since emergency department with chief complaint of seizure/syncope. Initial evaluation reveals pulmonary embolism.  Formation is obtained from the patient and friend who is at the bedside. Friend is a Mudlogger and reports episode of a witnessed seizure and loss of consciousness this morning at work. Patient states she was sitting at her desk and had not been a work long amend her next memory is lying on the floor when EMS arrived. She denies any recent illness nausea vomiting diarrhea. She denies any chest pain dizziness headache. She reports drinking aproximally 5 beers a day and she did have beer yesterday. Reportedly had seizure activity approximately 2 minutes with LOC. No loss of bladder or bowel control. No recent fever lightheadedness headache. She does report intermittent tingling of fourth and fifth fingers on her right hand. Nose any weakness of her right hand or right arm. Denies any neck injury    ED Course: In the emergency department she is afebrile hemodynamically stable and not hypoxic. She is nontoxic appearing  Review of Systems: As per HPI otherwise 10 point review of systems negative.   Ambulatory Status: Ambulates independently with a steady gait  Past Medical History:  Diagnosis Date  . Arrhythmia    PAF ON FLECAINIDE  . Atrial septal aneurysm    on aspirin  . Cancer ([place])    Melanoma- left cheek  . Carotid artery occlusion    Pt unaware  . Depression   . ETOH abuse   . Hemorrhoids   . History of kidney stones   . History of vertigo   . Hx of varicella   . Hx: UTI (urinary tract infection)   . Hyperlipidemia   . Seasonal allergies   . Seizure  ([place])   . Syncope     Past Surgical History:  Procedure Laterality Date  . Cataract  2015   Both eyes  . COLONOSCOPY    . EYE SURGERY    . MELANOMA EXCISION  2010   Left Cheek  . PHOTOCOAGULATION WITH LASER Right 07/31/2014   Procedure: PHOTOCOAGULATION WITH LASER;  Surgeon: Hayden Pedro, MD;  Location: [facility];  Service: Ophthalmology;  Laterality: Right;  . SCLERAL BUCKLE Right 07/31/2014   Procedure: SCLERAL BUCKLE RIGHT EYE;  Surgeon: Hayden Pedro, MD;  Location: [facility];  Service: Ophthalmology;  Laterality: Right;    Social History   Social History  . Marital status: Single    Spouse name: N/A  . Number of children: N/A  . Years of education: N/A   Occupational History  . Not on file.   Social History Main Topics  . Smoking status: Never Smoker  . Smokeless tobacco: Never Used  . Alcohol use 12.0 oz/week    20 Cans of beer per week  . Drug use: No  . Sexual activity: No   Other Topics Concern  . 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    Drinks 6-8 beers on most days.  Believes she has an alcohol abuse problem.  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     Allergies  Allergen Reactions  . Cephalosporins Rash  . Sulfonamide Derivatives Rash    Family History  Problem Relation Age of Onset  . Heart disease Father   . Alcohol abuse Father   . Bipolar disorder Father   . Dementia Mother   . Arthritis Mother   . Stroke Maternal Grandfather   . Heart disease Maternal Grandfather   . Lung cancer Paternal Grandmother   . Bipolar disorder Sister     father also     Prior to Admission medications   Medication Sig Start Date End Date Taking? Authorizing Provider  aspirin 325 MG tablet Take 325 mg by mouth daily.     Yes Historical Provider, MD  diltiazem (CARDIZEM CD)  240 MG 24 hr capsule Take 1 capsule (240 mg total) by mouth daily. 01/18/15  Yes Fay Records, MD  flecainide (TAMBOCOR) 50 MG tablet Take 2 tablets (100 mg total) by mouth 2 (two) times daily. 07/01/15  Yes Fay Records, MD  ibuprofen (ADVIL,MOTRIN) 200 MG tablet Take 200 mg by mouth every 6 (six) hours as needed for moderate pain.    Yes Historical Provider, MD  naproxen (NAPROSYN) 500 MG tablet Take 1 tablet (500 mg total) by mouth 2 (two) times daily with a meal. For up to 2 weeks and as needed 05/06/15  Yes Burnis Medin, MD  rosuvastatin (CRESTOR) 10 MG tablet Take 1 tablet (10 mg total) by mouth daily. 08/07/15  Yes Fay Records, MD  sertraline (ZOLOFT) 100 MG tablet TAKE (2) TABLETS DAILY. 09/24/15  Yes Burnis Medin, MD    Physical Exam: Vitals:   11/16/2015 1028 11/28/2015 1138 11/09/2015 1216 12/02/2015 1328  BP: 139/76 141/69 129/67 126/63  Pulse: 91 86 86 83  Resp: 21 18 24 23   SpO2: 95% 95% 98% 95%     General:  Appears calm and comfortable No acute distress Eyes:  PERRL, EOMI, normal lids, iris ENT:  grossly normal hearing, lips unremarkable, tongue left side hematoma no active bleeding somewhat dry appearing Neck:  no LAD, masses or thyromegaly Cardiovascular:  RRR, no m/r/g. No LE edema.  Respiratory:  CTA bilaterally, no w/r/r. Normal respiratory effort. Abdomen:  soft, ntnd, positive bowel sounds no guarding or rebounding Skin:  no rash or induration seen on limited exam Musculoskeletal:  grossly normal tone BUE/BLE, good ROM, no bony abnormality, joints without swelling/erythema Psychiatric:  grossly normal mood and affect, speech fluent and appropriate, AOx3 Neurologic:  CN 2-12 grossly intact, moves all extremities in coordinated fashion, sensation intact alert and oriented 3 speech clear  Labs on Admission: I have personally reviewed following labs and imaging studies  CBC:  Recent Labs Lab 12/03/2015 1002  WBC 11.3*  NEUTROABS 10.1*  HGB 13.9  HCT 41.8  MCV 100.5*   PLT [id number]   Basic Metabolic Panel:  Recent Labs Lab 11/06/2015 1002  NA 140  K 4.4  CL 109  CO2 25  GLUCOSE 131*  BUN 13  CREATININE 1.04*  CALCIUM 9.7   GFR: Estimated Creatinine Clearance: 51.4 mL/min (by C-G formula based on SCr of 1.04 mg/dL). Liver Function Tests:  Recent Labs Lab 12/01/2015 1002  AST 41  ALT 22  ALKPHOS 70  BILITOT 0.8  PROT 6.8  ALBUMIN 4.0   No results for input(s): LIPASE, AMYLASE in the last 168 hours. No results for input(s):  AMMONIA in the last 168 hours. Coagulation Profile: No results for input(s): INR, PROTIME in the last 168 hours. Cardiac Enzymes: No results for input(s): CKTOTAL, CKMB, CKMBINDEX, TROPONINI in the last 168 hours. BNP (last 3 results) No results for input(s): PROBNP in the last 8760 hours. HbA1C: No results for input(s): HGBA1C in the last 72 hours. CBG: No results for input(s): GLUCAP in the last 168 hours. Lipid Profile: No results for input(s): CHOL, HDL, LDLCALC, TRIG, CHOLHDL, LDLDIRECT in the last 72 hours. Thyroid Function Tests: No results for input(s): TSH, T4TOTAL, FREET4, T3FREE, THYROIDAB in the last 72 hours. Anemia Panel: No results for input(s): VITAMINB12, FOLATE, FERRITIN, TIBC, IRON, RETICCTPCT in the last 72 hours. Urine analysis:    Component Value Date/Time   COLORURINE YELLOW 11/17/2015 1107   APPEARANCEUR CLOUDY (A) 11/21/2015 1107   LABSPEC 1.022 11/10/2015 1107   PHURINE 5.5 11/09/2015 1107   GLUCOSEU NEGATIVE 12/04/2015 1107   HGBUR MODERATE (A) 11/10/2015 1107   BILIRUBINUR NEGATIVE 11/15/2015 1107   KETONESUR 15 (A) 12/01/2015 1107   PROTEINUR NEGATIVE 11/16/2015 1107   NITRITE NEGATIVE 11/29/2015 1107   LEUKOCYTESUR MODERATE (A) 11/10/2015 1107    Creatinine Clearance: Estimated Creatinine Clearance: 51.4 mL/min (by C-G formula based on SCr of 1.04 mg/dL).  Sepsis Labs: @LABRCNTIP (procalcitonin:4,lacticidven:4) )No results found for this or any previous visit (from the past  240 hour(s)).   Radiological Exams on Admission: Ct Head Wo Contrast  Result Date: 11/13/2015 CLINICAL DATA:  Loss of consciousness. Syncopal episode/seizure while sitting at desk at work. EXAM: CT HEAD WITHOUT CONTRAST TECHNIQUE: Contiguous axial images were obtained from the base of the skull through the vertex without intravenous contrast. COMPARISON:  None. FINDINGS: There is no evidence of acute cortical infarct, intracranial hemorrhage, mass, midline shift, or extra-axial fluid collection. Ventricles and sulci are normal. Cerebral white matter hypodensities are nonspecific but compatible with mild chronic small vessel ischemic disease. Bilateral cataract extraction in right scleral buckle are noted. There is right parietal scalp swelling. The visualized paranasal sinuses and mastoid air cells are clear. No skull fracture is identified. IMPRESSION: 1. No evidence of acute intracranial abnormality. 2. Right parietal scalp swelling. 3. Mild chronic small vessel ischemic disease. Electronically Signed   By: Logan Bores M.D.   On: 11/12/2015 10:33   Ct Angio Chest Pe W And/or Wo Contrast  Result Date: 11/21/2015 CLINICAL DATA:  Loss of consciousness.  Syncope versus seizure. EXAM: CT ANGIOGRAPHY CHEST WITH CONTRAST TECHNIQUE: Multidetector CT imaging of the chest was performed using the standard protocol during bolus administration of intravenous contrast. Multiplanar CT image reconstructions and MIPs were obtained to evaluate the vascular anatomy. CONTRAST:  100 mL Isovue 370 COMPARISON:  Chest radiograph 05/22/2004 FINDINGS: Mediastinum/Lymph Nodes: Pulmonary arterial opacification is adequate, however there is mild motion artifact diffusely which limits subsegmental evaluation. There is a single apparent filling defect involving a distal segmental artery at its branch point in the anterior basal right lower lobe (series 7, image 117). No other definite emboli are identified, and there is no central  embolus. No masses or pathologically enlarged lymph nodes identified. Lungs/Pleura: Evaluation of the lung parenchyma is mildly limited by respiratory motion artifact. There is mild dependent atelectasis in the lung bases. No lung mass or pleural effusion. Major airways are patent. Upper abdomen: 7 mm hypodensity in the left hepatic lobe, likely a cyst. Small sliding hiatal hernia. Musculoskeletal: Old lateral right ninth and tenth rib fractures. Moderate thoracic dextroscoliosis with mild multilevel disc degeneration. Review  of the MIP images confirms the above findings. IMPRESSION: Suspected single distal segmental pulmonary embolus in the right lower lobe. No central emboli. These results were called by telephone at the time of interpretation on 11/22/2015 at 12:29 pm to Dr. Justine Null, who verbally acknowledged these results. Electronically Signed   By: Logan Bores M.D.   On: 12/02/2015 12:30    EKG: Independently reviewed. Sinus rhythm PACs  Assessment/Plan Principal Problem:   Seizure ([place]) Active Problems:   HYPERCHOLESTEROLEMIA   Alcohol abuse   Atrial fibrillation ([place])   Hyperlipidemia   Syncope   Pulmonary embolism ([place])   #1. Seizure. Etiology uncertain. CT no acute abnormality. No signs of infection. No metabolic derrangements. Urine drug screen negative. EtOH level less than 5.  -Admit to telemetry -Seizure precautions -When necessary Ativan -EEG  #2. Syncope. Hx afib.  Patient had an elevated d-dimer in the emergency department. CTA of chest with suspected single distal segmental pulmonary embolus in the right lower lobe. Evidence of hypoxia. EKG with sinus rhythm. Initial troponin negative. Dynamically stable. No signs of infectious process. No metabolic derangement. Has not had an interruption in her alcohol consumption -Admit to telemetry -Obtain orthostatic vital signs -Gentle IV fluids -Echocardiogram  #3. Pulmonary embolism. Per CTA has noted above. Hypoxia or shortness of  breath. No recent travel. Has a remote history of melanoma on her cheek. No shortness of breath chest pain. Excess pin 325 mg daily.  She denies ever having a discussion regarding anticoagulation chart review support same. Heparin started in the emergency department -Continue heparin -Transition to oral anticoagulation tomorrow -will likely need anticoagulation 3-6 months -recommend hypercoag work up once off anticoagulation  #4. EtOH abuse. She reports drinking 5 beers daily. Last beer was yesterday. No s/sx withdrawal.  -CIWA protocol   DVT prophylaxis: heparin  Code Status: full  Family Communication: friend at bedside  Disposition Plan: home  Consults called: none  Admission status: obs    Dyanne Carrel M MD [facility]  If 7PM-7AM, please contact night-coverage [email or web address] Password [email or web address]  11/28/2015, 1:45 PM

## 2015-11-05 NOTE — ED Notes (Signed)
Attempted for a second time to obtain urine specimen; placed pt on bedpan, however pt still unable to provide urine sample at this time

## 2015-11-05 NOTE — Progress Notes (Signed)
ANTICOAGULATION CONSULT NOTE - Initial Consult  Pharmacy Consult for heparin Indication: pulmonary embolus  Allergies  Allergen Reactions  . Cephalosporins Rash  . Sulfonamide Derivatives Rash    Patient Measurements:   Heparin Dosing Weight: 70kg  Vital Signs: BP: 129/67 (08/01 1216) Pulse Rate: 86 (08/01 1216)  Labs:  Recent Labs  11/13/2015 1002  HGB 13.9  HCT 41.8  PLT 169  CREATININE 1.04*    Estimated Creatinine Clearance: 51.4 mL/min (by C-G formula based on SCr of 1.04 mg/dL).  Assessment: 57 YOF with history of AFib not on anticoagulants PTA (on ASA 325mg ) who presents after witnessed seizure and LOC. DDimer elevated and CTA showed PE. Baseline Hgb 13.9, plts 169- no overt bleeding noted at baseline.    Goal of Therapy:  Heparin level 0.3-0.7 units/ml Monitor platelets by anticoagulation protocol: Yes   Plan:  -heparinn bolus with 4000 units IV x1 then start infusion at 1050 units/hr -heparin level in 6h -daily HL and CBC -follow for s/s bleeding and long term AC plans  Ovidio Steele D. Zakaiya Lares, PharmD, BCPS Clinical Pharmacist Pager: [phone] 12/04/2015 12:52 PM

## 2015-11-05 NOTE — ED Notes (Signed)
Patient transported to CT 

## 2015-11-05 NOTE — ED Triage Notes (Signed)
Pt in from work via [employer] [facility] after 3-4 witnessed seizures. Per EMS, pt was at [employer], at work sitting in a chair when she suddenly fell out of chair, hit floor and started seizing. Witnesses say 3-4 seizures occurred, about 2 min total time. Pt had +LOC. Hx of unknown heart problem per pt, no hx of seizures.

## 2015-11-06 ENCOUNTER — Observation Stay (HOSPITAL_COMMUNITY): Payer: BC Managed Care – PPO

## 2015-11-06 ENCOUNTER — Inpatient Hospital Stay (HOSPITAL_COMMUNITY): Payer: BC Managed Care – PPO

## 2015-11-06 DIAGNOSIS — R579 Shock, unspecified: Secondary | ICD-10-CM | POA: Diagnosis not present

## 2015-11-06 DIAGNOSIS — I480 Paroxysmal atrial fibrillation: Secondary | ICD-10-CM | POA: Diagnosis not present

## 2015-11-06 DIAGNOSIS — F1010 Alcohol abuse, uncomplicated: Secondary | ICD-10-CM | POA: Diagnosis not present

## 2015-11-06 DIAGNOSIS — R40243 Glasgow coma scale score 3-8, unspecified time: Secondary | ICD-10-CM | POA: Diagnosis not present

## 2015-11-06 DIAGNOSIS — R402324 Coma scale, best motor response, extension, 24 hours or more after hospital admission: Secondary | ICD-10-CM | POA: Diagnosis not present

## 2015-11-06 DIAGNOSIS — S065X9A Traumatic subdural hemorrhage with loss of consciousness of unspecified duration, initial encounter: Secondary | ICD-10-CM | POA: Diagnosis present

## 2015-11-06 DIAGNOSIS — Z66 Do not resuscitate: Secondary | ICD-10-CM | POA: Diagnosis not present

## 2015-11-06 DIAGNOSIS — R4020 Unspecified coma: Secondary | ICD-10-CM | POA: Diagnosis not present

## 2015-11-06 DIAGNOSIS — R55 Syncope and collapse: Secondary | ICD-10-CM

## 2015-11-06 DIAGNOSIS — I2510 Atherosclerotic heart disease of native coronary artery without angina pectoris: Secondary | ICD-10-CM | POA: Diagnosis present

## 2015-11-06 DIAGNOSIS — G931 Anoxic brain damage, not elsewhere classified: Secondary | ICD-10-CM | POA: Diagnosis not present

## 2015-11-06 DIAGNOSIS — I469 Cardiac arrest, cause unspecified: Secondary | ICD-10-CM | POA: Diagnosis not present

## 2015-11-06 DIAGNOSIS — E669 Obesity, unspecified: Secondary | ICD-10-CM | POA: Diagnosis present

## 2015-11-06 DIAGNOSIS — E7800 Pure hypercholesterolemia, unspecified: Secondary | ICD-10-CM

## 2015-11-06 DIAGNOSIS — R569 Unspecified convulsions: Secondary | ICD-10-CM | POA: Diagnosis present

## 2015-11-06 DIAGNOSIS — J9601 Acute respiratory failure with hypoxia: Secondary | ICD-10-CM | POA: Diagnosis not present

## 2015-11-06 DIAGNOSIS — Z6828 Body mass index (BMI) 28.0-28.9, adult: Secondary | ICD-10-CM | POA: Diagnosis not present

## 2015-11-06 DIAGNOSIS — Z515 Encounter for palliative care: Secondary | ICD-10-CM | POA: Diagnosis not present

## 2015-11-06 DIAGNOSIS — R402214 Coma scale, best verbal response, none, 24 hours or more after hospital admission: Secondary | ICD-10-CM | POA: Diagnosis not present

## 2015-11-06 DIAGNOSIS — E1165 Type 2 diabetes mellitus with hyperglycemia: Secondary | ICD-10-CM | POA: Diagnosis present

## 2015-11-06 DIAGNOSIS — G936 Cerebral edema: Secondary | ICD-10-CM | POA: Diagnosis not present

## 2015-11-06 DIAGNOSIS — J690 Pneumonitis due to inhalation of food and vomit: Secondary | ICD-10-CM | POA: Diagnosis not present

## 2015-11-06 DIAGNOSIS — R402114 Coma scale, eyes open, never, 24 hours or more after hospital admission: Secondary | ICD-10-CM | POA: Diagnosis not present

## 2015-11-06 DIAGNOSIS — G935 Compression of brain: Secondary | ICD-10-CM | POA: Diagnosis not present

## 2015-11-06 DIAGNOSIS — I2699 Other pulmonary embolism without acute cor pulmonale: Principal | ICD-10-CM

## 2015-11-06 DIAGNOSIS — G9340 Encephalopathy, unspecified: Secondary | ICD-10-CM | POA: Diagnosis not present

## 2015-11-06 DIAGNOSIS — I613 Nontraumatic intracerebral hemorrhage in brain stem: Secondary | ICD-10-CM | POA: Diagnosis not present

## 2015-11-06 DIAGNOSIS — Z86711 Personal history of pulmonary embolism: Secondary | ICD-10-CM | POA: Diagnosis not present

## 2015-11-06 LAB — BASIC METABOLIC PANEL
Anion gap: 7 (ref 5–15)
BUN: 9 mg/dL (ref 6–20)
CHLORIDE: 108 mmol/L (ref 101–111)
CO2: 27 mmol/L (ref 22–32)
CREATININE: 0.72 mg/dL (ref 0.44–1.00)
Calcium: 9 mg/dL (ref 8.9–10.3)
GFR calc non Af Amer: >60 mL/min (ref >60)
Glucose, Bld: 110 mg/dL — ABNORMAL HIGH (ref 65–99)
Potassium: 3.8 mmol/L (ref 3.5–5.1)
Sodium: 142 mmol/L (ref 135–145)

## 2015-11-06 LAB — CBC
HEMATOCRIT: 39.1 % (ref 36.0–46.0)
HEMOGLOBIN: 12.7 g/dL (ref 12.0–15.0)
MCH: 32.6 pg (ref 26.0–34.0)
MCHC: 32.5 g/dL (ref 30.0–36.0)
MCV: 100.5 fL — AB (ref 78.0–100.0)
Platelets: 160 10*3/uL (ref 150–400)
RBC: 3.89 MIL/uL (ref 3.87–5.11)
RDW: 12.5 % (ref 11.5–15.5)
WBC: 8.7 10*3/uL (ref 4.0–10.5)

## 2015-11-06 LAB — GLUCOSE, CAPILLARY
GLUCOSE-CAPILLARY: 113 mg/dL — AB (ref 65–99)
GLUCOSE-CAPILLARY: 319 mg/dL — AB (ref 65–99)
Glucose-Capillary: 198 mg/dL — ABNORMAL HIGH (ref 65–99)

## 2015-11-06 LAB — HEPARIN LEVEL (UNFRACTIONATED)
Heparin Unfractionated: 0.47 IU/mL (ref 0.30–0.70)
Heparin Unfractionated: 0.48 IU/mL (ref 0.30–0.70)

## 2015-11-06 LAB — ECHOCARDIOGRAM COMPLETE: WEIGHTICAEL: 2600 [oz_av]

## 2015-11-06 MED ORDER — SODIUM CHLORIDE 0.9 % IV SOLN
3.0000 g | Freq: Four times a day (QID) | INTRAVENOUS | Status: DC
  Administered 2015-11-07 (×2): 3 g via INTRAVENOUS
  Filled 2015-11-06 (×4): qty 3

## 2015-11-06 MED ORDER — VITAMIN B-1 100 MG PO TABS
100.0000 mg | ORAL_TABLET | Freq: Every day | ORAL | Status: DC
  Administered 2015-11-07: 100 mg
  Filled 2015-11-06: qty 1

## 2015-11-06 MED ORDER — ROSUVASTATIN CALCIUM 10 MG PO TABS
10.0000 mg | ORAL_TABLET | Freq: Every day | ORAL | Status: DC
  Administered 2015-11-07: 10 mg
  Filled 2015-11-06: qty 1

## 2015-11-06 MED ORDER — ASPIRIN 325 MG PO TABS
325.0000 mg | ORAL_TABLET | Freq: Every day | ORAL | Status: DC
  Administered 2015-11-07: 325 mg
  Filled 2015-11-06: qty 1

## 2015-11-06 MED ORDER — SODIUM CHLORIDE 0.9 % IV SOLN
INTRAVENOUS | Status: DC
Start: 2015-11-07 — End: 2015-11-07
  Administered 2015-11-07: 01:00:00 via INTRAVENOUS

## 2015-11-06 MED ORDER — INSULIN ASPART 100 UNIT/ML ~~LOC~~ SOLN
0.0000 [IU] | SUBCUTANEOUS | Status: DC
  Administered 2015-11-06: 11 [IU] via SUBCUTANEOUS
  Administered 2015-11-07 (×3): 2 [IU] via SUBCUTANEOUS

## 2015-11-06 MED ORDER — ADULT MULTIVITAMIN W/MINERALS CH
1.0000 | ORAL_TABLET | Freq: Every day | ORAL | Status: DC
  Administered 2015-11-07: 1
  Filled 2015-11-06: qty 1

## 2015-11-06 MED ORDER — FAMOTIDINE 40 MG/5ML PO SUSR
40.0000 mg | Freq: Two times a day (BID) | ORAL | Status: DC
  Administered 2015-11-07 (×2): 40 mg
  Filled 2015-11-06 (×2): qty 5

## 2015-11-06 MED ORDER — FLECAINIDE ACETATE 100 MG PO TABS
100.0000 mg | ORAL_TABLET | Freq: Two times a day (BID) | ORAL | Status: DC
  Administered 2015-11-07: 100 mg
  Filled 2015-11-06: qty 1

## 2015-11-06 MED ORDER — FOLIC ACID 1 MG PO TABS
1.0000 mg | ORAL_TABLET | Freq: Every day | ORAL | Status: DC
  Administered 2015-11-07: 1 mg
  Filled 2015-11-06: qty 1

## 2015-11-06 MED ORDER — SERTRALINE HCL 100 MG PO TABS
200.0000 mg | ORAL_TABLET | Freq: Every day | ORAL | Status: DC
  Administered 2015-11-07: 200 mg
  Filled 2015-11-06: qty 2

## 2015-11-06 NOTE — Consult Note (Signed)
NEURO HOSPITALIST CONSULT NOTE   Requestig physician: Dr. Salome Arnt   Reason for Consult: syncope with myoclonus versus new onset seizure   HPI:                                                                                                                                          [name] is an 65 y.o. female with medical history significant for EtOH abuse, A. fib on Cardizem and aspirin. Arrived at ED with chief complaint of seizure/syncope. Patient states she was at her desk when suddenly she lost consciousness and the nex thing she realizes is that she was on the ground with EMS. She had no warning or dizziness or tunnel vision. She was told she was out for 2 minutes. There is some report of ? Seizure but event was unwitnessed. She denies Hx of Seizure, Trauma, head trauma, stroke, febrile seizure, abnormal birthing process or family history of Seizure. Currently she is back to baseline. EEG was obtained and this  Showed no abnormality.   Past Medical History:  Diagnosis Date  . Anginal pain ([place])   . Anxiety   . Arrhythmia    PAF ON FLECAINIDE  . Arthritis    "hands" (11/13/2015)  . Atrial septal aneurysm    on aspirin  . Carotid artery occlusion    Pt unaware  . Depression   . ETOH abuse   . Hemorrhoids   . History of vertigo   . Hx of varicella   . Hx: UTI (urinary tract infection)   . Hyperlipidemia   . Kidney stones   . Left pulmonary embolus ([place]) 11/28/2015  . Melanoma of cheek ([place]) 2010   left   . Seasonal allergies   . Seizure [facility])    "might have had my 1st one today" (12/02/2015)  . Syncope     Past Surgical History:  Procedure Laterality Date  . BREAST LUMPECTOMY     "don't remember when or which side"  . CATARACT EXTRACTION W/ INTRAOCULAR LENS  IMPLANT, BILATERAL Bilateral 2015   Both eyes  . COLONOSCOPY    . EYE SURGERY    . MELANOMA EXCISION Left 2010   cheek  . PHOTOCOAGULATION WITH LASER Right 07/31/2014   Procedure:  PHOTOCOAGULATION WITH LASER;  Surgeon: Hayden Pedro, MD;  Location: [place];  Service: Ophthalmology;  Laterality: Right;  . SCLERAL BUCKLE Right 07/31/2014   Procedure: SCLERAL BUCKLE RIGHT EYE;  Surgeon: Hayden Pedro, MD;  Location: [place];  Service: Ophthalmology;  Laterality: Right;    Family History  Problem Relation Age of Onset  . Heart disease Father   . Alcohol abuse Father   . Bipolar disorder Father   . Dementia Mother   . Arthritis Mother   .  Stroke Maternal Grandfather   . Heart disease Maternal Grandfather   . Lung cancer Paternal Grandmother   . Bipolar disorder Sister     father also     Social History:  reports that she has never smoked. She has never used smokeless tobacco. She reports that she drinks about 12.0 oz of alcohol per week . She reports that she does not use drugs.  Allergies  Allergen Reactions  . Cephalosporins Rash  . Sulfonamide Derivatives Rash    MEDICATIONS:                                                                                                                     Prior to Admission:  Prescriptions Prior to Admission  Medication Sig Dispense Refill Last Dose  . aspirin 325 MG tablet Take 325 mg by mouth daily.     11/28/2015 at Unknown time  . diltiazem (CARDIZEM CD) 240 MG 24 hr capsule Take 1 capsule (240 mg total) by mouth daily. 30 capsule 11 11/11/2015 at 0700  . flecainide (TAMBOCOR) 50 MG tablet Take 2 tablets (100 mg total) by mouth 2 (two) times daily. 120 tablet 5 11/17/2015 at Unknown time  . ibuprofen (ADVIL,MOTRIN) 200 MG tablet Take 200 mg by mouth every 6 (six) hours as needed for moderate pain.    Past Week at Unknown time  . naproxen (NAPROSYN) 500 MG tablet Take 1 tablet (500 mg total) by mouth 2 (two) times daily with a meal. For up to 2 weeks and as needed 30 tablet 0 PRN  . rosuvastatin (CRESTOR) 10 MG tablet Take 1 tablet (10 mg total) by mouth daily. 15 tablet 3 11/11/2015 at Unknown time  . sertraline (ZOLOFT) 100 MG  tablet TAKE (2) TABLETS DAILY. 180 tablet 0 11/12/2015 at Unknown time   Scheduled: . aspirin  325 mg Oral Daily  . diltiazem  240 mg Oral Daily  . flecainide  100 mg Oral BID  . folic acid  1 mg Oral Daily  . multivitamin with minerals  1 tablet Oral Daily  . naproxen  500 mg Oral BID WC  . rosuvastatin  10 mg Oral Daily  . sertraline  200 mg Oral Daily  . sodium chloride flush  3 mL Intravenous Q12H  . thiamine  100 mg Oral Daily     ROS:  History obtained from the patient  General ROS: negative for - chills, fatigue, fever, night sweats, weight gain or weight loss Psychological ROS: negative for - behavioral disorder, hallucinations, memory difficulties, mood swings or suicidal ideation Ophthalmic ROS: negative for - blurry vision, double vision, eye pain or loss of vision ENT ROS: negative for - epistaxis, nasal discharge, oral lesions, sore throat, tinnitus or vertigo Allergy and Immunology ROS: negative for - hives or itchy/watery eyes Hematological and Lymphatic ROS: negative for - bleeding problems, bruising or swollen lymph nodes Endocrine ROS: negative for - galactorrhea, hair pattern changes, polydipsia/polyuria or temperature intolerance Respiratory ROS: negative for - cough, hemoptysis, shortness of breath or wheezing Cardiovascular ROS: negative for - chest pain, dyspnea on exertion, edema or irregular heartbeat Gastrointestinal ROS: negative for - abdominal pain, diarrhea, hematemesis, nausea/vomiting or stool incontinence Genito-Urinary ROS: negative for - dysuria, hematuria, incontinence or urinary frequency/urgency Musculoskeletal ROS: negative for - joint swelling or muscular weakness Neurological ROS: as noted in HPI Dermatological ROS: negative for rash and skin lesion changes   Blood pressure 123/62, pulse 82, temperature 98.4 F  (36.9 C), temperature source Oral, resp. rate 18, weight 73.7 kg (162 lb 8 oz), SpO2 97 %.   Neurologic Examination:                                                                                                      HEENT-  Normocephalic, no lesions, without obvious abnormality.  Normal external eye and conjunctiva.  Normal TM's bilaterally.  Normal auditory canals and external ears. Normal external nose, mucus membranes and septum.  Normal pharynx. Cardiovascular- S1, S2 normal, pulses palpable throughout   Lungs- chest clear, no wheezing, rales, normal symmetric air entry Abdomen- normal findings: bowel sounds normal Extremities- no edema Lymph-no adenopathy palpable Musculoskeletal-no joint tenderness, deformity or swelling Skin-warm and dry, no hyperpigmentation, vitiligo, or suspicious lesions  Neurological Examination Mental Status: Alert, oriented, thought content appropriate.  Speech fluent without evidence of aphasia.  Able to follow 3 step commands without difficulty. Cranial Nerves: II: Visual fields grossly normal, pupils equal, round, reactive to light and accommodation III,IV, VI: ptosis not present, extra-ocular motions intact bilaterally V,VII: smile symmetric, facial light touch sensation normal bilaterally VIII: hearing normal bilaterally IX,X: uvula rises symmetrically XI: bilateral shoulder shrug XII: midline tongue extension Motor: Right : Upper extremity   5/5    Left:     Upper extremity   5/5  Lower extremity   5/5     Lower extremity   5/5 --mild drift on the right hand but no dropping of her arm.  Tone and bulk:normal tone throughout; no atrophy noted Sensory: Pinprick and light touch intact throughout, bilaterally Deep Tendon Reflexes: 2+ and symmetric throughout Plantars: Right: downgoing   Left: downgoing Cerebellar: normal finger-to-nose, and normal heel-to-shin test Gait: not tested      Lab Results: Basic Metabolic Panel:  Recent Labs Lab  12/01/2015 1002 11/06/15 0320  NA 140 142  K 4.4 3.8  CL 109 108  CO2 25 27  GLUCOSE 131* 110*  BUN 13  9  CREATININE 1.04* 0.72  CALCIUM 9.7 9.0    Liver Function Tests:  Recent Labs Lab 11/11/2015 1002  AST 41  ALT 22  ALKPHOS 70  BILITOT 0.8  PROT 6.8  ALBUMIN 4.0   No results for input(s): LIPASE, AMYLASE in the last 168 hours. No results for input(s): AMMONIA in the last 168 hours.  CBC:  Recent Labs Lab 11/23/2015 1002 11/06/15 0320  WBC 11.3* 8.7  NEUTROABS 10.1*  --   HGB 13.9 12.7  HCT 41.8 39.1  MCV 100.5* 100.5*  PLT 169 160    Cardiac Enzymes: No results for input(s): CKTOTAL, CKMB, CKMBINDEX, TROPONINI in the last 168 hours.  Lipid Panel: No results for input(s): CHOL, TRIG, HDL, CHOLHDL, VLDL, LDLCALC in the last 168 hours.  CBG:  Recent Labs Lab 11/06/15 0654  GLUCAP 113*    Microbiology: No results found for this or any previous visit.  Coagulation Studies: No results for input(s): LABPROT, INR in the last 72 hours.  Imaging: Ct Head Wo Contrast  Result Date: 11/12/2015 CLINICAL DATA:  Loss of consciousness. Syncopal episode/seizure while sitting at desk at work. EXAM: CT HEAD WITHOUT CONTRAST TECHNIQUE: Contiguous axial images were obtained from the base of the skull through the vertex without intravenous contrast. COMPARISON:  None. FINDINGS: There is no evidence of acute cortical infarct, intracranial hemorrhage, mass, midline shift, or extra-axial fluid collection. Ventricles and sulci are normal. Cerebral white matter hypodensities are nonspecific but compatible with mild chronic small vessel ischemic disease. Bilateral cataract extraction in right scleral buckle are noted. There is right parietal scalp swelling. The visualized paranasal sinuses and mastoid air cells are clear. No skull fracture is identified. IMPRESSION: 1. No evidence of acute intracranial abnormality. 2. Right parietal scalp swelling. 3. Mild chronic small vessel  ischemic disease. Electronically Signed   By: Logan Bores M.D.   On: 11/06/2015 10:33   Ct Angio Chest Pe W And/or Wo Contrast  Result Date: 11/27/2015 CLINICAL DATA:  Loss of consciousness.  Syncope versus seizure. EXAM: CT ANGIOGRAPHY CHEST WITH CONTRAST TECHNIQUE: Multidetector CT imaging of the chest was performed using the standard protocol during bolus administration of intravenous contrast. Multiplanar CT image reconstructions and MIPs were obtained to evaluate the vascular anatomy. CONTRAST:  100 mL Isovue 370 COMPARISON:  Chest radiograph 05/22/2004 FINDINGS: Mediastinum/Lymph Nodes: Pulmonary arterial opacification is adequate, however there is mild motion artifact diffusely which limits subsegmental evaluation. There is a single apparent filling defect involving a distal segmental artery at its branch point in the anterior basal right lower lobe (series 7, image 117). No other definite emboli are identified, and there is no central embolus. No masses or pathologically enlarged lymph nodes identified. Lungs/Pleura: Evaluation of the lung parenchyma is mildly limited by respiratory motion artifact. There is mild dependent atelectasis in the lung bases. No lung mass or pleural effusion. Major airways are patent. Upper abdomen: 7 mm hypodensity in the left hepatic lobe, likely a cyst. Small sliding hiatal hernia. Musculoskeletal: Old lateral right ninth and tenth rib fractures. Moderate thoracic dextroscoliosis with mild multilevel disc degeneration. Review of the MIP images confirms the above findings. IMPRESSION: Suspected single distal segmental pulmonary embolus in the right lower lobe. No central emboli. These results were called by telephone at the time of interpretation on 11/16/2015 at 12:29 pm to Dr. Justine Null, who verbally acknowledged these results. Electronically Signed   By: Logan Bores M.D.   On: 12/02/2015 12:30       Assessment and  plan per attending neurologist  Etta Quill PA-C [facility] [phone]  11/06/2015, 9:57 AM   Assessment/Plan: 65 YO female with syncopal episode. Given the short period of loss of consciousness, and the negative EEG at this time would not start a AED.  Agree with Echo and may consider Holter monitor to evaluate for possible Arrhythmia. At this time no further neurologic diagnostic testing recommended.       Neurology Attending Addendum  This patient was seen, examined, and d/w PA. I have reviewed the note and agree with the findings, assessment and plan as documented with the following additions.   In brief, this is a 89-yo woman admitted after a spell at work on 11/16/2015. She remembers sitting at her desk and then nothing until coming to on a stretcher with paramedics around her. She felt mildly confused on waking but states that she was back to her normal baseline within a couple of minutes. It is unclear if the episode was witnessed or not as notes are conflicting. There was mention of tonic-clonic activity while she was unconscious but it does not sound as if anyone saw the beginning of the spell to note which came first, the LOC or the tonic-clonic activity. The patient denies any aura or premonitory symptoms and has never had any other similar spells.   Exam is normal.   CTH without contrast from 11/16/2015 has been personally and independently reviewed. No acute intracranial pathology is noted. She has mild chronic small vessel ischemic change. There is a right parietal soft tissue hematoma.   EEG was normal.   Impression: 1. Syncope vs seizure: Unclear what this spell was at this point and either of these is possible based upon available information. Cardiac workup is underway. EEG and CTH were unremarkable. I do not think MRI brain will add much to this picture and would defer for now. Even if this were seizure, it would represent a first-time event. With normal exam, normal EEG, and normal imaging there would be no indication  for AED therapy. She drinks quite a bit and I agree with alcohol cessation as this would be imperative in either case. Continue to monitor for EtOH withdrawal and treat as necessary.   This was discussed with the patient and she is in agreement with the plan as stated. She was given the opportunity to ask questions and these were addressed to her satisfaction. I have no additional recommendations at this time and will sign off. Please call if any new issues arise.

## 2015-11-06 NOTE — Code Documentation (Signed)
  Patient Name: [name]   MRN: [id number]   Date of Birth/ Sex: November 19, 1950 , female      Admission Date: 11/06/2015  Attending Provider: Collene Gobble, MD  Primary Diagnosis: Seizure [facility])   Indication: Pt was in her usual state of health until this PM, when she was noted to be unresponsive and slumped over with emesis on gown. Patient diaphoretic, unresponsive, with stridor. Code blue was subsequently called. At the time of arrival on scene, ACLS protocol was underway.   Technical Description:  - CPR performance duration:  1 minute  - Was defibrillation or cardioversion used? No   - Was external pacer placed? No  - Was patient intubated pre/post CPR? Yes   Medications Administered: Y = Yes; Blank = No Amiodarone    Atropine    Calcium    Epinephrine  Y  Lidocaine    Magnesium    Norepinephrine    Phenylephrine    Sodium bicarbonate    Vasopressin     Post CPR evaluation:  - Final Status - Was patient successfully resuscitated ? Yes - What is current rhythm? Normal sinus rhythm - What is current hemodynamic status? Stable.  Miscellaneous Information:  - Labs sent, including: CBG, CXR  - Primary team notified?  Yes  - Family Notified? Yes  - Additional notes/ transfer status: Patient transferred to [facility]M for further management.     Juliet Rude, MD  11/06/2015, 11:20 PM

## 2015-11-06 NOTE — Consult Note (Addendum)
PULMONARY / CRITICAL CARE MEDICINE   Name: [name] MRN: [id number] DOB: 02/20/1951    ADMISSION DATE:  12/03/2015 CONSULTATION DATE:  11/06/15  REFERRING MD:  Dr Tana Coast, TRH  CHIEF COMPLAINT:  Acute respiratory failure / respiratory arrest  HISTORY OF PRESENT ILLNESS:   65 yo woman, hx EtOH abuse, A Fib (not on anticoagulation). She suffered a syncopal episode versus seizure on 12/02/2015 prompting admission. Per notes she has continued to drink usual 5 beers a day. Part of the evaluation included Ct-PA that showed a subsegmental RLL PE for which she was started on anticoagulation. Evening of 8/2 was found unresponsive with emesis present, stridorous respirations. Last seen normal ~ 55 minutes before. Became pulseless > underwent CPR for 30 seconds, airway placed and she had ROSC. Transfers now to ICU for further care. Per RN she has not required any prn ativan, has not been agitated. No tonic-clonic activity reported with this event. On arrival unresponsive, hypotensive.   PAST MEDICAL HISTORY :  She  has a past medical history of Anginal pain ([place]); Anxiety; Arrhythmia; Arthritis; Atrial septal aneurysm; Carotid artery occlusion; Depression; ETOH abuse; Hemorrhoids; History of vertigo; varicella; UTI (urinary tract infection); Hyperlipidemia; Kidney stones; Left pulmonary embolus ([place]) (11/28/2015); Melanoma of cheek ([place]) (2010); Seasonal allergies; Seizure ([place]); and Syncope.  PAST SURGICAL HISTORY: She  has a past surgical history that includes Melanoma excision (Left, 2010); Eye surgery; Colonoscopy; Scleral buckle (Right, 07/31/2014); Photocoagulation with laser (Right, 07/31/2014); Cataract extraction w/ intraocular lens  implant, bilateral (Bilateral, 2015); and Breast lumpectomy.  Allergies  Allergen Reactions  . Cephalosporins Rash  . Sulfonamide Derivatives Rash    No current facility-administered medications on file prior to encounter.    Current Outpatient Prescriptions on  File Prior to Encounter  Medication Sig  . aspirin 325 MG tablet Take 325 mg by mouth daily.    Marland Kitchen diltiazem (CARDIZEM CD) 240 MG 24 hr capsule Take 1 capsule (240 mg total) by mouth daily.  . flecainide (TAMBOCOR) 50 MG tablet Take 2 tablets (100 mg total) by mouth 2 (two) times daily.  Marland Kitchen ibuprofen (ADVIL,MOTRIN) 200 MG tablet Take 200 mg by mouth every 6 (six) hours as needed for moderate pain.   . naproxen (NAPROSYN) 500 MG tablet Take 1 tablet (500 mg total) by mouth 2 (two) times daily with a meal. For up to 2 weeks and as needed  . rosuvastatin (CRESTOR) 10 MG tablet Take 1 tablet (10 mg total) by mouth daily.  . sertraline (ZOLOFT) 100 MG tablet TAKE (2) TABLETS DAILY.    FAMILY HISTORY:  Her indicated that her mother is deceased. She indicated that her father is deceased. She indicated that one of her two sisters is deceased. She indicated that her maternal grandfather is deceased. She indicated that her paternal grandmother is deceased.    SOCIAL HISTORY: She  reports that she has never smoked. She has never used smokeless tobacco. She reports that she drinks about 12.0 oz of alcohol per week . She reports that she does not use drugs.  REVIEW OF SYSTEMS:   Unable to obtain  SUBJECTIVE:   VITAL SIGNS: BP 118/65 (BP Location: Right Arm)   Pulse 72   Temp 98.9 F (37.2 C) (Oral)   Resp 17   Wt 73.7 kg (162 lb 8 oz)   SpO2 98%   BMI 28.79 kg/m   HEMODYNAMICS:    VENTILATOR SETTINGS:    INTAKE / OUTPUT: No intake/output data recorded.  PHYSICAL EXAMINATION:  General:  Ill appearing woman, intubated and unresponsive (off sedation) Neuro:  Unresponsive to pain, voice, any stimulation HEENT:  Pupils 4-75mm and non-reactive, ETT in place Cardiovascular:  Regular, no M Lungs:  Clear B  Abdomen:  Obese, soft, positive BS Musculoskeletal:  No deformities or edema Skin:  No rash, cool extremities  LABS:  BMET  Recent Labs Lab 12/02/2015 1002 11/06/15 0320  NA 140  142  K 4.4 3.8  CL 109 108  CO2 25 27  BUN 13 9  CREATININE 1.04* 0.72  GLUCOSE 131* 110*    Electrolytes  Recent Labs Lab 11/06/2015 1002 11/06/15 0320  CALCIUM 9.7 9.0    CBC  Recent Labs Lab 11/10/2015 1002 11/06/15 0320  WBC 11.3* 8.7  HGB 13.9 12.7  HCT 41.8 39.1  PLT 169 160    Coag's No results for input(s): APTT, INR in the last 168 hours.  Sepsis Markers No results for input(s): LATICACIDVEN, PROCALCITON, O2SATVEN in the last 168 hours.  ABG No results for input(s): PHART, PCO2ART, PO2ART in the last 168 hours.  Liver Enzymes  Recent Labs Lab 12/03/2015 1002  AST 41  ALT 22  ALKPHOS 70  BILITOT 0.8  ALBUMIN 4.0    Cardiac Enzymes No results for input(s): TROPONINI, PROBNP in the last 168 hours.  Glucose  Recent Labs Lab 11/06/15 0654 11/06/15 2214  GLUCAP 113* 319*    Imaging No results found.   STUDIES:  CT-PA 8/1 >> single distal RLL subsegmental PE, mild B dependent atelectasis TTE 8/2 >> normal LV fxn, normal RV fxn  CULTURES: Urine 8/2 >>   ANTIBIOTICS: Unasyn 8/2 >>   SIGNIFICANT EVENTS:   LINES/TUBES: ETT 8/2 >>   DISCUSSION: 65 yo w EtOH abuse, admitted following syncope vs possible (first ever) seizure. Noted to have a small RLL PE and started on heparin. Found unresponsive progressing to resp arrest and brief pulselessness. Etiology unclear but consider emesis event with UA occlusion, recurrent seizure activity (no evidence tonic-clonic activity reported)  ASSESSMENT / PLAN:  PULMONARY A: Acute respiratory failure with hypoxemia, also due to inability to protect airway RLL subsegmental PE P:   PRVC 8cc/kg ABG in 1 h Continue heparin gtt; may need to consider possibility of recurrent larger PE as cause for her acute decompensation although we do have alternative explanations (aspiration event, possible seizure). No evidence that we need to consider lytics at this time.   CARDIOVASCULAR A:  Shock post PEA  arrest / resp arrest A Fib Hyperlipidemia  P:  IVF bolus now May need to hold diltiazem depending on course Continue flecainide  May need CVC placement and pressors depending on how she recovers post arrest Serial troponins; note normal LV and RV fxn on TTE prior to event, could consider repeat TTE of large PE remains on the DDx  RENAL A:   No issues P:   Follow intermittent BMP, UOP  GASTROINTESTINAL A:   SUP Nutrition P:   Add pepcid TF if she remains intubated 8/3  HEMATOLOGIC A:   No acute issues P:  Follow intermittent CBC  INFECTIOUS A:   Suspected aspiration event, aspiration PNA P:   unasyn started empirically 8/2; note cephalosporin allergy > rash. May have to change to alternative   ENDOCRINE A:   Hyperglycemia  P:    NEUROLOGIC A:   Syncopal event versus Seizure (new if present) Acute encephalopathy, ? Due to occult status epilepticus, ? Toxic metabolic or anoxic with resp compriomise for unclear period of  time. At risk for acute bleed EtOH abuse P:   RASS goal: 0 Hold sedating meds for now and follow MS for improvement EEG now, STAT CT head Possible MRI brain if head Ct unremarkable, especially if we believe this is recurrent seizure activity. Will defer this decision to neurology  Continue home sertraline Continue folate and B1   FAMILY  - Updates: No family at bedside 8/2 pm  - Inter-disciplinary family meet or Palliative Care meeting due by: 11/13/15  Independent CC time 75 minutes   Baltazar Apo, MD, PhD 11/06/2015, 11:25 PM [facility] [phone] or if no answer [phone]

## 2015-11-06 NOTE — Progress Notes (Signed)
Pharmacy Antibiotic Note  [name] is a 65 y.o. female admitted on Nov 06, 2015, tonight pt coded after episode of emesis, now intubated and w/ concern for aspiration.  Pharmacy has been consulted for Unasyn dosing.  Plan: Unasyn 3g IV Q6H.  Weight: 162 lb 8 oz (73.7 kg)  Temp (24hrs), Avg:98.6 F (37 C), Min:96.8 F (36 C), Max:99.3 F (37.4 C)   Recent Labs Lab November 06, 2015 1002 11/06/15 0320  WBC 11.3* 8.7  CREATININE 1.04* 0.72    Estimated Creatinine Clearance: 68.3 mL/min (by C-G formula based on SCr of 0.8 mg/dL).    Allergies  Allergen Reactions  . Cephalosporins Rash  . Sulfonamide Derivatives Rash     Thank you for allowing pharmacy to be a part of this patient's care.  Wynona Neat, PharmD, BCPS  11/06/2015 11:56 PM

## 2015-11-06 NOTE — Progress Notes (Signed)
[facility]                                                                              Patient Demographics  [name], is a 65 y.o. female, DOB - September 22, 1950, [id number]  Admit date - 11/14/2015   Admitting Physician Waldemar Dickens, MD  Outpatient Primary MD for the patient is Lottie Dawson, MD  Outpatient specialists:   LOS - 0  days    Chief Complaint  Patient presents with  . Seizures       Brief summary   [name] is a very pleasant 65 y.o. female with medical history significant for EtOH abuse, A. fib on Cardizem and aspirin since emergency department with chief complaint of seizure/syncope. Initial evaluation reveals pulmonary embolism. History was obtained from the patient and friend who is at the bedside. Friend is a [employer] and reported episode of a witnessed seizure and loss of consciousness at work. Patient stated she was sitting at her desk and and her next memory was lying on the floor when EMS arrived. She denied any recent illness nausea vomiting diarrhea. She denied any chest pain dizziness headache. She reported drinking aproximally 5 beers a day and she did have beer a day before. Reportedly had seizure activity approximately 2 minutes with LOC. No loss of bladder or bowel control. No recent fever lightheadedness headache. She reported intermittent tingling of fourth and fifth fingers on her right hand.   Assessment & Plan    Principal Problem: Seizure. Etiology uncertain. CT no acute abnormality. No signs of infection. No metabolic derrangements. Urine drug screen negative. EtOH level less than 5., No interruption in her alcohol consumption  -EEG negative, continue seizure precautions - Nephrology consulted, recommended MRI of the brain with and without contrast. We'll follow recommendations.   Possible syncope with underlying history of atrial fibrillation. Not on [facility] - Patient had an elevated d-dimer in the  emergency department. CTA of chest with suspected single distal segmental pulmonary embolus in the right lower lobe. EKG with sinus rhythm. Initial troponin negative.  - Continue gentle hydration, UA showed ketones, follow 2-D echocardiogram - Follow urine cultures and sensitivity (currently no symptoms, fevers or leukocytosis)   Pulmonary embolism. Per CTA has noted above. Hypoxia or shortness of breath. 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 Has a remote history of melanoma on her cheek.  -Continue heparin - Case management consult for NOAC. Discussed in detail with the patient regarding Coumadin/Lovenox injections versus NOAC (xarelto, eliquis). Patient prefers NOAC's. Will place on NOACs once the co-pays are determined by case management - Follow Dopplers ultrasound of the lower extremities, likely need anticoagulation 3-6 months - recommend hypercoag work up once off anticoagulation   EtOH abuse. She reports drinking 5 beers daily. Last beer was yesterday. No s/sx withdrawal.  -CIWA protocol    Code Status: full  DVT Prophylaxis:   heparin  Family Communication: Discussed in detail with the patient, all imaging results, lab results explained to the patient   Disposition Plan: DC in 24-48hrs   Time Spent in minutes   25 minutes  Procedures:  CT  angiogram chest  Consultants:   Neurology  Antimicrobials:      Medications  Scheduled Meds: . aspirin  325 mg Oral Daily  . diltiazem  240 mg Oral Daily  . flecainide  100 mg Oral BID  . folic acid  1 mg Oral Daily  . multivitamin with minerals  1 tablet Oral Daily  . naproxen  500 mg Oral BID WC  . rosuvastatin  10 mg Oral Daily  . sertraline  200 mg Oral Daily  . sodium chloride flush  3 mL Intravenous Q12H  . thiamine  100 mg Oral Daily   Continuous Infusions: . heparin 1,050 Units/hr (11/06/15 0659)   PRN Meds:.acetaminophen **OR** acetaminophen, alum & mag hydroxide-simeth, ibuprofen, LORazepam, LORazepam  **OR** LORazepam, ondansetron **OR** ondansetron (ZOFRAN) IV   Antibiotics   Anti-infectives    None        Subjective:   [name] was seen and examined today.  Patient denies dizziness, chest pain, shortness of breath, abdominal pain, N/V/D/C, new weakness, numbess, tingling. No acute events overnight.    Objective:   Vitals:   11/06/15 0135 11/06/15 0500 11/06/15 0621 11/06/15 1025  BP: 127/80  123/62 120/69  Pulse: 75  82 78  Resp: 19  18 16   Temp: 99.2 F (37.3 C)  98.4 F (36.9 C) 99 F (37.2 C)  TempSrc: Oral  Oral Oral  SpO2: 99%  97% 98%  Weight:  73.7 kg (162 lb 8 oz)     No intake or output data in the 24 hours ending 11/06/15 1231   Wt Readings from Last 3 Encounters:  11/06/15 73.7 kg (162 lb 8 oz)  10/23/15 70.3 kg (155 lb)  05/06/15 69.4 kg (153 lb)     Exam  General: Alert and oriented x 3, NAD  HEENT:  PERRLA, EOMI, Anicteric Sclera, mucous membranes moist.   Neck: Supple, no JVD, no masses  Cardiovascular: S1 S2 auscultated, no rubs, murmurs or gallops. Regular rate and rhythm.  Respiratory: Clear to auscultation bilaterally, no wheezing, rales or rhonchi  Gastrointestinal: Soft, nontender, nondistended, + bowel sounds  Ext: no cyanosis clubbing or edema  Neuro: AAOx3, Cr N's II- XII. Strength 5/5 upper and lower extremities bilaterally  Skin: No rashes  Psych: Normal affect and demeanor, alert and oriented x3    Data Reviewed:  I have personally reviewed following labs and imaging studies  Micro Results No results found for this or any previous visit (from the past 240 hour(s)).  Radiology Reports Ct Head Wo Contrast  Result Date: 12/02/2015 CLINICAL DATA:  Loss of consciousness. Syncopal episode/seizure while sitting at desk at work. EXAM: CT HEAD WITHOUT CONTRAST TECHNIQUE: Contiguous axial images were obtained from the base of the skull through the vertex without intravenous contrast. COMPARISON:  None. FINDINGS: There  is no evidence of acute cortical infarct, intracranial hemorrhage, mass, midline shift, or extra-axial fluid collection. Ventricles and sulci are normal. Cerebral white matter hypodensities are nonspecific but compatible with mild chronic small vessel ischemic disease. Bilateral cataract extraction in right scleral buckle are noted. There is right parietal scalp swelling. The visualized paranasal sinuses and mastoid air cells are clear. No skull fracture is identified. IMPRESSION: 1. No evidence of acute intracranial abnormality. 2. Right parietal scalp swelling. 3. Mild chronic small vessel ischemic disease. Electronically Signed   By: Logan Bores M.D.   On: 11/19/2015 10:33   Ct Angio Chest Pe W And/or Wo Contrast  Result Date: 11/25/2015 CLINICAL DATA:  Loss of  consciousness.  Syncope versus seizure. EXAM: CT ANGIOGRAPHY CHEST WITH CONTRAST TECHNIQUE: Multidetector CT imaging of the chest was performed using the standard protocol during bolus administration of intravenous contrast. Multiplanar CT image reconstructions and MIPs were obtained to evaluate the vascular anatomy. CONTRAST:  100 mL Isovue 370 COMPARISON:  Chest radiograph 05/22/2004 FINDINGS: Mediastinum/Lymph Nodes: Pulmonary arterial opacification is adequate, however there is mild motion artifact diffusely which limits subsegmental evaluation. There is a single apparent filling defect involving a distal segmental artery at its branch point in the anterior basal right lower lobe (series 7, image 117). No other definite emboli are identified, and there is no central embolus. No masses or pathologically enlarged lymph nodes identified. Lungs/Pleura: Evaluation of the lung parenchyma is mildly limited by respiratory motion artifact. There is mild dependent atelectasis in the lung bases. No lung mass or pleural effusion. Major airways are patent. Upper abdomen: 7 mm hypodensity in the left hepatic lobe, likely a cyst. Small sliding hiatal hernia.  Musculoskeletal: Old lateral right ninth and tenth rib fractures. Moderate thoracic dextroscoliosis with mild multilevel disc degeneration. Review of the MIP images confirms the above findings. IMPRESSION: Suspected single distal segmental pulmonary embolus in the right lower lobe. No central emboli. These results were called by telephone at the time of interpretation on 11/26/2015 at 12:29 pm to Dr. Justine Null, who verbally acknowledged these results. Electronically Signed   By: Logan Bores M.D.   On: 11/25/2015 12:30    Lab Data:  CBC:  Recent Labs Lab 11/14/2015 1002 11/06/15 0320  WBC 11.3* 8.7  NEUTROABS 10.1*  --   HGB 13.9 12.7  HCT 41.8 39.1  MCV 100.5* 100.5*  PLT 169 0000000   Basic Metabolic Panel:  Recent Labs Lab 12/04/2015 1002 11/06/15 0320  NA 140 142  K 4.4 3.8  CL 109 108  CO2 25 27  GLUCOSE 131* 110*  BUN 13 9  CREATININE 1.04* 0.72  CALCIUM 9.7 9.0   GFR: Estimated Creatinine Clearance: 68.3 mL/min (by C-G formula based on SCr of 0.8 mg/dL). Liver Function Tests:  Recent Labs Lab 11/14/2015 1002  AST 41  ALT 22  ALKPHOS 70  BILITOT 0.8  PROT 6.8  ALBUMIN 4.0   No results for input(s): LIPASE, AMYLASE in the last 168 hours. No results for input(s): AMMONIA in the last 168 hours. Coagulation Profile: No results for input(s): INR, PROTIME in the last 168 hours. Cardiac Enzymes: No results for input(s): CKTOTAL, CKMB, CKMBINDEX, TROPONINI in the last 168 hours. BNP (last 3 results) No results for input(s): PROBNP in the last 8760 hours. HbA1C: No results for input(s): HGBA1C in the last 72 hours. CBG:  Recent Labs Lab 11/06/15 0654  GLUCAP 113*   Lipid Profile: No results for input(s): CHOL, HDL, LDLCALC, TRIG, CHOLHDL, LDLDIRECT in the last 72 hours. Thyroid Function Tests:  Recent Labs  11/21/2015 1518  TSH 1.116   Anemia Panel: No results for input(s): VITAMINB12, FOLATE, FERRITIN, TIBC, IRON, RETICCTPCT in the last 72 hours. Urine  analysis:    Component Value Date/Time   COLORURINE YELLOW 11/10/2015 1107   APPEARANCEUR CLOUDY (A) 11/29/2015 1107   LABSPEC 1.022 11/10/2015 1107   PHURINE 5.5 11/08/2015 1107   GLUCOSEU NEGATIVE 11/27/2015 1107   HGBUR MODERATE (A) 12/03/2015 1107   BILIRUBINUR NEGATIVE 11/14/2015 1107   KETONESUR 15 (A) 11/18/2015 1107   PROTEINUR NEGATIVE 11/13/2015 1107   NITRITE NEGATIVE 11/11/2015 1107   LEUKOCYTESUR MODERATE (A) 11/22/2015 1107     Marcy Bogosian M.D.  [facility] 11/06/2015, 12:31 PM  Pager: [phone] Between 7am to 7pm - call Pager - [phone]  After 7pm go to [email or web address] - password [email or web address]  Call night coverage person covering after 7pm

## 2015-11-06 NOTE — Progress Notes (Signed)
  Echocardiogram 2D Echocardiogram has been performed.  [name] 11/06/2015, 1:03 PM

## 2015-11-06 NOTE — Progress Notes (Signed)
ANTICOAGULATION CONSULT NOTE - Follow Up Consult  Pharmacy Consult for Heparin  Indication: pulmonary embolus, new onset  Allergies  Allergen Reactions  . Cephalosporins Rash  . Sulfonamide Derivatives Rash    Vital Signs: Temp: 98.2 F (36.8 C) (08/01 2100) Temp Source: Oral (08/01 2100) BP: 113/65 (08/01 2100) Pulse Rate: 74 (08/01 2100)  Labs:  Recent Labs  11/28/2015 1002 11/30/2015 1933 11/06/2015 2350  HGB 13.9  --   --   HCT 41.8  --   --   PLT 169  --   --   HEPARINUNFRC  --  0.52 0.47  CREATININE 1.04*  --   --     Estimated Creatinine Clearance: 51.4 mL/min (by C-G formula based on SCr of 1.04 mg/dL).   Assessment: New onset PE, heparin level is therapeutic x 2  Goal of Therapy:  Heparin level 0.3-0.7 units/ml Monitor platelets by anticoagulation protocol: Yes   Plan:  -Cont heparin at 1050 units/hr -Daily CBC/HL -Monitor for bleeding  Narda Bonds 11/06/2015,1:04 AM

## 2015-11-06 NOTE — Progress Notes (Signed)
Pt c/o headache at 2000. Given Ibuprofen 200mg . Rechecked on pt at 2055, pt asleep resting comfortably in bed in no acute distress. Rounding on patient to give 2200 meds at 2150 and noted patient was slumped over in bed with emesis on gown. Diaphoretic, unresponsive with stridor noted. Pulse present radial, called a code and started BVM respirations. Pt lost pulse temporarily, compressions started for approximately 1 minute before return of pulse. Code team arrived and pt intubated and moved to 2M04. Friend notified in chart. MD on call notified.

## 2015-11-06 NOTE — Progress Notes (Signed)
ANTICOAGULATION CONSULT NOTE - Follow Up Consult  Pharmacy Consult for Heparin  Indication: pulmonary embolus, new onset  Allergies  Allergen Reactions  . Cephalosporins Rash  . Sulfonamide Derivatives Rash    Vital Signs: Temp: 99 F (37.2 C) (08/02 1025) Temp Source: Oral (08/02 1025) BP: 120/69 (08/02 1025) Pulse Rate: 78 (08/02 1025)  Labs:  Recent Labs  11/11/2015 1002 11/20/2015 1933 11/24/2015 2350 11/06/15 0320  HGB 13.9  --   --  12.7  HCT 41.8  --   --  39.1  PLT 169  --   --  160  HEPARINUNFRC  --  0.52 0.47 0.48  CREATININE 1.04*  --   --  0.72    Estimated Creatinine Clearance: 68.3 mL/min (by C-G formula based on SCr of 0.8 mg/dL).  Assessment: 71 yof with acute PE continues on IV heparin for anticoagulation. Heparin level remains at goal. No bleeding noted and CBC is WNL.   Goal of Therapy:  Heparin level 0.3-0.7 units/ml Monitor platelets by anticoagulation protocol: Yes   Plan:  -Cont heparin at 1050 units/hr -Daily CBC/HL -F/u plans for oral anticoagulation  Emiko Osorto, Rande Lawman 11/06/2015,10:47 AM

## 2015-11-06 NOTE — Progress Notes (Signed)
NP paged because pt was being coded. Upon NP arrival, pt had pulse and was intubated. Pt transferred to 79M and care assumed by PCCM. Report to Dr. Lamonte Sakai. Per RN, [name], he had seen pt just minutes prior to event and she was sleeping. When he came back to see pt, she was found with stridor and emesis in mouth and on gown. Code blue called. Pt received chest compressions for about 30 secs and one dose of Epi with ROSC. Intubated by anesthesia and was a "difficult intubation".  No witnessed seizure activity by staff prior to arrest.

## 2015-11-07 ENCOUNTER — Inpatient Hospital Stay (HOSPITAL_COMMUNITY): Payer: BC Managed Care – PPO

## 2015-11-07 ENCOUNTER — Encounter (HOSPITAL_COMMUNITY): Payer: Self-pay | Admitting: Radiology

## 2015-11-07 ENCOUNTER — Encounter (HOSPITAL_COMMUNITY): Payer: BC Managed Care – PPO

## 2015-11-07 DIAGNOSIS — I609 Nontraumatic subarachnoid hemorrhage, unspecified: Secondary | ICD-10-CM

## 2015-11-07 DIAGNOSIS — I6340 Cerebral infarction due to embolism of unspecified cerebral artery: Secondary | ICD-10-CM | POA: Insufficient documentation

## 2015-11-07 DIAGNOSIS — I619 Nontraumatic intracerebral hemorrhage, unspecified: Secondary | ICD-10-CM | POA: Insufficient documentation

## 2015-11-07 DIAGNOSIS — R40243 Glasgow coma scale score 3-8, unspecified time: Secondary | ICD-10-CM

## 2015-11-07 DIAGNOSIS — I613 Nontraumatic intracerebral hemorrhage in brain stem: Secondary | ICD-10-CM

## 2015-11-07 DIAGNOSIS — G931 Anoxic brain damage, not elsewhere classified: Secondary | ICD-10-CM

## 2015-11-07 DIAGNOSIS — Z515 Encounter for palliative care: Secondary | ICD-10-CM

## 2015-11-07 LAB — BLOOD GAS, ARTERIAL
ACID-BASE DEFICIT: 5.3 mmol/L — AB (ref 0.0–2.0)
BICARBONATE: 20.4 meq/L (ref 20.0–24.0)
DRAWN BY: 44135
FIO2: 1
O2 SAT: 99.2 %
PEEP/CPAP: 5 cmH2O
PH ART: 7.272 — AB (ref 7.350–7.450)
Patient temperature: 98.6
RATE: 18 resp/min
TCO2: 21.8 mmol/L (ref 0–100)
VT: 360 mL
pCO2 arterial: 45.7 mmHg — ABNORMAL HIGH (ref 35.0–45.0)
pO2, Arterial: 260 mmHg — ABNORMAL HIGH (ref 80.0–100.0)

## 2015-11-07 LAB — BASIC METABOLIC PANEL
ANION GAP: 4 — AB (ref 5–15)
BUN: 6 mg/dL (ref 6–20)
CALCIUM: 7.8 mg/dL — AB (ref 8.9–10.3)
CO2: 24 mmol/L (ref 22–32)
Chloride: 111 mmol/L (ref 101–111)
Creatinine, Ser: 0.72 mg/dL (ref 0.44–1.00)
GFR calc non Af Amer: >60 mL/min (ref >60)
GLUCOSE: 165 mg/dL — AB (ref 65–99)
POTASSIUM: 3.4 mmol/L — AB (ref 3.5–5.1)
Sodium: 139 mmol/L (ref 135–145)

## 2015-11-07 LAB — GLUCOSE, CAPILLARY
GLUCOSE-CAPILLARY: 302 mg/dL — AB (ref 65–99)
GLUCOSE-CAPILLARY: 135 mg/dL — AB (ref 65–99)
GLUCOSE-CAPILLARY: 125 mg/dL — AB (ref 65–99)
Glucose-Capillary: 126 mg/dL — ABNORMAL HIGH (ref 65–99)

## 2015-11-07 LAB — URINE CULTURE

## 2015-11-07 LAB — CBC
HCT: 37.9 % (ref 36.0–46.0)
Hemoglobin: 12.4 g/dL (ref 12.0–15.0)
MCH: 33.2 pg (ref 26.0–34.0)
MCHC: 32.7 g/dL (ref 30.0–36.0)
MCV: 101.3 fL — ABNORMAL HIGH (ref 78.0–100.0)
PLATELETS: 161 10*3/uL (ref 150–400)
RBC: 3.74 MIL/uL — ABNORMAL LOW (ref 3.87–5.11)
RDW: 12.6 % (ref 11.5–15.5)
WBC: 20.9 10*3/uL — AB (ref 4.0–10.5)

## 2015-11-07 LAB — TROPONIN I
TROPONIN I: 0.08 ng/mL — AB (ref <0.03)
Troponin I: 0.67 ng/mL (ref <0.03)

## 2015-11-07 MED ORDER — IOPAMIDOL (ISOVUE-370) INJECTION 76%: 50.0000 mL | Freq: Once | INTRAVENOUS | Status: DC | PRN

## 2015-11-07 MED ORDER — PROTAMINE SULFATE 10 MG/ML IV SOLN
50.0000 mg | INTRAVENOUS | Status: AC
  Administered 2015-11-07: 50 mg via INTRAVENOUS
  Filled 2015-11-07: qty 5

## 2015-11-07 MED ORDER — MANNITOL 25 % IV SOLN
75.0000 g | INTRAVENOUS | Status: AC
  Administered 2015-11-07: 75 g via INTRAVENOUS
  Filled 2015-11-07: qty 300

## 2015-11-07 MED ORDER — ANTISEPTIC ORAL RINSE SOLUTION (CORINZ)
7.0000 mL | Freq: Four times a day (QID) | OROMUCOSAL | Status: DC
  Administered 2015-11-07: 7 mL via OROMUCOSAL

## 2015-11-07 MED ORDER — PROTAMINE SULFATE 10 MG/ML IV SOLN
50.0000 mg | Freq: Once | INTRAVENOUS | Status: DC
Start: 2015-11-07 — End: 2015-11-07

## 2015-11-07 MED ORDER — CHLORHEXIDINE GLUCONATE 0.12% ORAL RINSE (MEDLINE KIT)
15.0000 mL | Freq: Two times a day (BID) | OROMUCOSAL | Status: DC
  Administered 2015-11-07: 15 mL via OROMUCOSAL

## 2015-11-07 MED ORDER — MORPHINE BOLUS VIA INFUSION
5.0000 mg | INTRAVENOUS | Status: DC | PRN
  Filled 2015-11-07: qty 20

## 2015-11-07 MED ORDER — MORPHINE SULFATE 25 MG/ML IV SOLN
10.0000 mg/h | INTRAVENOUS | Status: DC
  Administered 2015-11-07: 10 mg/h via INTRAVENOUS
  Filled 2015-11-07: qty 10

## 2015-11-07 MED ORDER — POTASSIUM CHLORIDE 20 MEQ/15ML (10%) PO SOLN
20.0000 meq | ORAL | Status: AC
  Administered 2015-11-07 (×2): 20 meq
  Filled 2015-11-07 (×2): qty 15

## 2015-11-11 ENCOUNTER — Telehealth: Payer: Self-pay

## 2015-11-11 NOTE — Telephone Encounter (Signed)
On 11/11/15, I received a death certificate from [place] ([facility]: [employer]). Dr. Nelda Marseille should be the one to sign the death certificate, but he is on vacation until 11/18/15. I emailed Dennison Bulla (Cc: Sharyn Lull) to see if there is an alternative for the death certificate to be sign sooner. Lanny Hurst is researching. Lanny Hurst wrote me back (I forwarded the e-mail to [place]). Lanny Hurst said, "he will ask if Dr. Lamonte Sakai will sign the death certificate for Mr. [place]". 11/11/15 ab  On 12/04/15, Dr. Lamonte Sakai signed the death certificate for Dr. Nelda Marseille. [place] has been called to let them know the death certificate is ready for pick up. 11/18/15 ab

## 2015-11-14 ENCOUNTER — Telehealth: Payer: Self-pay

## 2015-11-14 NOTE — Telephone Encounter (Signed)
On 11/14/2015 I received the death certificate back from Doctor Byrum. I got the death certificate ready and called the funeral home to let them know the death certificate is ready for pickup. I also faxed them a copy per their request.

## 2015-11-19 ENCOUNTER — Encounter: Payer: BC Managed Care – PPO | Admitting: Internal Medicine

## 2015-12-06 NOTE — Progress Notes (Signed)
OG placement with tip and side port below diaphragm in stomach.[name], AGACNP-BC [facility] Pulmonology/Critical Care Pager [phone] or [phone]  12/03/15 3:21 AM

## 2015-12-06 NOTE — Progress Notes (Signed)
[facility] Progress Note Patient Name: [name] DOB: Feb 11, 1951 MRN: [id number]   Date of Service  November 19, 2015  HPI/Events of Note  CT chead shows large subdural bleed with poor neuro function, Eddie Dibbles notified Neurology and Neuro surgery  eICU Interventions  Await further recs from Neuro surgery-but prognosis seems very poor.  patient now DNR, Donor services notified     Intervention Category Major Interventions: Other:  Terril Chestnut 11-19-2015, 1:28 AM

## 2015-12-06 NOTE — Care Management Note (Addendum)
Case Management Note  Patient Details  Name: [name] MRN: [id number] Date of Birth: [birth year]  Subjective/Objective:                   65 year old female with past medical history of alcohol abuse, paroxysmal A. Fib (on Flecainide) who presents the ED via EMS status post LOC, onset prior to arrival. / Home alone.  Action/Plan: Follow for disposition needs.   Expected Discharge Date:  11-10-15               Expected Discharge Plan:  Home/Self Care  In-House Referral:  NA  Discharge planning Services  CM Consult  Post Acute Care Choice:    Choice offered to:     DME Arranged:    DME Agency:     HH Arranged:    HH Agency:     Status of Service:  In process, will continue to follow  If discussed at Long Length of Stay Meetings, dates discussed:    Additional Comments: 11-10-15 Pt remains on pressor for hypotention.  Now full comfort care Maryclare Labrador, RN 11-10-2015, 9:47 AM

## 2015-12-06 NOTE — Plan of Care (Signed)
Reviewed image and examined pt. Her pupils dilated and fixed, no papillary reflex, no corneal, but positive gag. On pain stimulation, mild withdraw both LEs. Not on sedation at all since beginning. CT showed large SDH and 4th ventricle bleeding with extensive midline shift and uncal herniation. NSG no surgical intervention. Pt likely not survivable. Exteremly poor prognosis. Discussed with Dr. Drinda Butts and I agree with withdraw care, comfort care measures based on clinical findings and very poor prognosis.   Rosalin Hawking, MD PhD Stroke Neurology 11-27-2015 1:18 PM

## 2015-12-06 NOTE — Progress Notes (Signed)
Pt transferred to and from head CT without incident.

## 2015-12-06 NOTE — Progress Notes (Addendum)
Subjective: Called by CCM due to patient being unresponsive after code. She was found unresponsive and a code was called. She received CPR briefly, but it was unclear how long she was in a state of agonal respirations/hypotension prior to being found(LKW 20:45). Seen again at 21:50 unresponsive.   Exam: Vitals:   11/06/15 2320 Nov 14, 2015 0006  BP: (!) 78/55 90/62  Pulse: 67 69  Resp: 15 (!) 25  Temp:     Gen: intubated Resp: ventilated Abd: soft, nt  Neuro: MS: Does not open eyes or follow commands. Does move head slightly when noxious stimulus applied to right arm.   [id number] and fixed bilaterally, no corneal, no gag/cough. She does breath over the vent slightly.  Motor: flexion to noxious stimuli in all 4 extremities.  Sensory:as above.   Pertinent Labs: Bmp 3 am yesterday- unremarkable.   Impression: 65 yo F with encephalopathy following hypoxic/hypotensive episode. Discussed with telemetry, no clear signs of arrythmia prior to event. Her current exam appears most notable for absent brainstem reflexes which I suspect is related to a hypoxic brain injury, though a severe brainstem hemorrhage would also be possible.   Recommendations: 1) CT head, if no bleed then CTA head/neck to rule out basilar thrombosis.  2) EEG 3) will continue to follow.   Roland Rack, MD [facility] [phone]  If 7pm- 7am, please page neurology on call as listed in [place].  I accompanied the patient to CT, revealed large SDH with herniation. Also  4th ventricular bleed with what I suspect is an intraparenchymal component, or actually predominantly intraparenchymal blood. I do not think that this is a survivable hemorrhage. I did order protamine and mannitol, but she has  A grim prognosis and I do not think that she will survive this, certainly not with any quality of life.    This patient is critically ill and at significant risk of neurological worsening, death and care requires  constant monitoring of vital signs, hemodynamics,respiratory and cardiac monitoring, neurological assessment, discussion with family, other specialists and medical decision making of high complexity. I spent 50 minutes of neurocritical care time  in the care of  this patient.  Roland Rack, MD [facility] [phone]  If 7pm- 7am, please page neurology on call as listed in [place]. 11/14/2015  1:36 AM

## 2015-12-06 NOTE — Progress Notes (Signed)
   November 14, 2015 1400  Clinical Encounter Type  Visited With Patient and family together  Visit Type Follow-up;Death  Spiritual Encounters  Spiritual Needs Prayer;Grief support  Stress Factors  Patient Stress Factors Loss  Family Stress Factors Loss   Chaplain doing rounds on floor and patient passed.  Chaplain able to offer ministry of presence to patient's friends and prayed for them.    Vilinda Blanks Jiselle Sheu 11-14-15 2:12 PM

## 2015-12-06 NOTE — Progress Notes (Addendum)
CRITICAL VALUE ALERT  Critical value received:  Troponin 0.67  Date of notification:  08/03/207  Time of notification:    Critical value read back:Yes.    Nurse who received alert:  Jannifer Franklin, RN  MD notified (1st page):    Time of first page:    MD notified (2nd page):  Time of second page:  Responding MD:  Dr. Mortimer Fries  Time MD responded:  [id number]

## 2015-12-06 NOTE — Discharge Summary (Signed)
[name], [name] NO.:  [id number]  MEDICAL RECORD NO.:  [id number]  LOCATION:  2M04C                        FACILITY:  [facility]  PHYSICIAN:  Providence Lanius, MD  DATE OF BIRTH:  November 17, 1950  DATE OF ADMISSION:  11/08/2015 DATE OF DISCHARGE:  12/02/2015                              DISCHARGE SUMMARY   PRIMARY DIAGNOSIS/CAUSE OF DEATH:  Intracranial hemorrhage.  SECONDARY DIAGNOSES:  Acute respiratory failure, hypoxemia, pulmonary embolism, pulseless electrical activity cardiac arrest, atrial fibrillation, hyperlipidemia, suspected aspiration pneumonia, hyperglycemia.  HOSPITAL COURSE:  The patient is a 65 year old female with past medical history significant for alcohol abuse, presented to the hospital with an acute pulmonary embolism.  The patient was started on anticoagulation; however, overnight on Dec 02, 2015, the patient suffered a cardiac arrest, was found to have a large intracranial hemorrhage, presented to the intensive care unit where Neurology Service evaluated the patient and concluded that the patient has a very poor chance of any source of meaningful recovery.  At which point, since the patient had no family or friends were present, we have discussion and we then found that the patient has no reasonable chance of recovery.  The patient's closest family informed that the patient __________ that she would not want to be kept alive with extraordinary measures.  In essence, the patient had no family, __________ from Neurology Service concluded that the patient would be do not resuscitate and withdrawal of mechanical ventilation will be the best course of action.  The patient's friends agreed and the patient was started on morphine and extubated, expired shortly thereafter.     Providence Lanius, MD     WJY/MEDQ  D:  11/08/2015  T:  11/09/2015  Job:  [id number]

## 2015-12-06 NOTE — Progress Notes (Signed)
Contacted CD[facility] per Dr. Katherine Roan.

## 2015-12-06 NOTE — Progress Notes (Signed)
PCCM INTERVAL PROGRESS NOTE  [name] is s/p cardiac arrest here tonight, very poor neuro exam post arrest. CT head showing massive subdural hematoma and brain stem bleed. Have d/w neurosurgery Dr Saintclair Halsted who will evaluate, but feels as thought there will likely be no role for surgery. The patient has no family with the exception of some distant cousins. I have discussed the case with close friend Rise Patience. I described the severity of tonight's events and she feels as though [name] would not want heroic measures should she suffer a cardiac arrest. Code status will be DNR moving forward. Her prognosis is very poor and she may not survive the night.   Georgann Housekeeper, AGACNP-BC [facility] Pulmonology/Critical Care Pager [phone] or [phone]  December 04, 2015 1:18 AM

## 2015-12-06 NOTE — Progress Notes (Signed)
Received pt from [facility] intubated, received no meds for RSI.   Pupils 33mm and nonreactive, and pt unresponsive to painful stimuli. Will continue to monitor.

## 2015-12-06 NOTE — Procedures (Signed)
Extubation Procedure Note  Patient Details:   Name: [name] DOB: 1950/07/04 MRN: [id number]   Airway Documentation: Pt extubated per end of life protocol.      Evaluation  O2 sats: currently acceptable Complications: No apparent complications Patient did not tolerate procedure well. Bilateral Breath Sounds: Clear   No  Ned Grace 11/28/2015, 1:57 PM

## 2015-12-06 NOTE — Progress Notes (Signed)
[facility] ADULT ICU REPLACEMENT PROTOCOL FOR AM LAB REPLACEMENT ONLY  The patient does apply for the [facility] Adult ICU Electrolyte Replacment Protocol based on the criteria listed below:   1. Is GFR >/= 40 ml/min? Yes.    Patient's GFR today is >60 2. Is urine output >/= 0.5 ml/kg/hr for the last 6 hours? Yes.   Patient's UOP is 5.98 ml/kg/hr 3. Is BUN < 60 mg/dL? Yes.    Patient's BUN today is 6 4. Abnormal electrolyte  K 3.4 5. Ordered repletion with: per protocol 6. If a panic level lab has been reported, has the CCM MD in charge been notified? Yes.  .   Physician:  Percell Miller Nov 09, 2015 5:37 AM

## 2015-12-06 NOTE — Progress Notes (Signed)
240 cc morphine wasted ink sink, witnessed by Conley Simmonds RN

## 2015-12-06 NOTE — Consult Note (Signed)
Reason for Consult: Subdural hematoma Referring Physician: Critical-care medicine  [name] is an 65 y.o. female.  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  Past Medical History:  Diagnosis Date  . Anginal pain ([place])   . Anxiety   . Arrhythmia    PAF ON FLECAINIDE  . Arthritis    "hands" (11/20/2015)  . Atrial septal aneurysm    on aspirin  . Carotid artery occlusion    Pt unaware  . Depression   . Diabetes mellitus without complication ([place])   . ETOH abuse   . Hemorrhoids   . History of vertigo   . Hx of varicella   . Hx: UTI (urinary tract infection)   . Hyperlipidemia   . Kidney stones   . Left pulmonary embolus ([place]) 11/30/2015  . Melanoma of cheek ([place]) 2010   left   . Seasonal allergies   . Seizure [facility])    "might have had my 1st one today" (11/11/2015)  . Syncope     Past Surgical History:  Procedure Laterality Date  . BREAST LUMPECTOMY     "don't remember when or which side"  . CATARACT EXTRACTION W/ INTRAOCULAR LENS  IMPLANT, BILATERAL Bilateral 2015   Both eyes  . COLONOSCOPY    . EYE SURGERY    . MELANOMA EXCISION Left 2010   cheek  . PHOTOCOAGULATION WITH LASER Right 07/31/2014   Procedure: PHOTOCOAGULATION WITH LASER;  Surgeon: Hayden Pedro, MD;  Location: [place];  Service: Ophthalmology;  Laterality: Right;  . SCLERAL BUCKLE Right 07/31/2014   Procedure: SCLERAL BUCKLE RIGHT EYE;  Surgeon: Hayden Pedro, MD;  Location: [place];  Service: Ophthalmology;  Laterality: Right;    Family History  Problem Relation Age of Onset  . Heart disease Father   . Alcohol abuse Father   . Bipolar disorder Father   . Dementia Mother   . Arthritis Mother   . Stroke  Maternal Grandfather   . Heart disease Maternal Grandfather   . Lung cancer Paternal Grandmother   . Bipolar disorder Sister     father also     Social History:  reports that she has never smoked. She has never used smokeless tobacco. She reports that she drinks about 12.0 oz of alcohol per week . She reports that she does not use drugs.  Allergies:  Allergies  Allergen Reactions  . Cephalosporins Rash  . Sulfonamide Derivatives Rash    Medications: I have reviewed the patient's current medications.  Results for orders placed or performed during the hospital encounter of 11/26/2015 (from the past 48 hour(s))  CBC with Differential     Status: Abnormal   Collection Time: 11/22/2015 10:02 AM  Result Value Ref Range   WBC 11.3 (H) 4.0 - 10.5 K/uL   RBC 4.16 3.87 - 5.11 MIL/uL   Hemoglobin 13.9 12.0 - 15.0 g/dL   HCT 41.8 36.0 - 46.0 %   MCV 100.5 (H) 78.0 - 100.0 fL   MCH 33.4 26.0 - 34.0 pg   MCHC 33.3 30.0 - 36.0 g/dL   RDW 12.2 11.5 - 15.5 %   Platelets 169 150 - 400 K/uL   Neutrophils Relative % 89 %   Neutro Abs 10.1 (  H) 1.7 - 7.7 K/uL   Lymphocytes Relative 6 %   Lymphs Abs 0.7 0.7 - 4.0 K/uL   Monocytes Relative 5 %   Monocytes Absolute 0.5 0.1 - 1.0 K/uL   Eosinophils Relative 0 %   Eosinophils Absolute 0.0 0.0 - 0.7 K/uL   Basophils Relative 0 %   Basophils Absolute 0.0 0.0 - 0.1 K/uL  Comprehensive metabolic panel     Status: Abnormal   Collection Time: 11/17/2015 10:02 AM  Result Value Ref Range   Sodium 140 135 - 145 mmol/L   Potassium 4.4 3.5 - 5.1 mmol/L   Chloride 109 101 - 111 mmol/L   CO2 25 22 - 32 mmol/L   Glucose, Bld 131 (H) 65 - 99 mg/dL   BUN 13 6 - 20 mg/dL   Creatinine, Ser 1.04 (H) 0.44 - 1.00 mg/dL   Calcium 9.7 8.9 - 10.3 mg/dL   Total Protein 6.8 6.5 - 8.1 g/dL   Albumin 4.0 3.5 - 5.0 g/dL   AST 41 15 - 41 U/L   ALT 22 14 - 54 U/L   Alkaline Phosphatase 70 38 - 126 U/L   Total Bilirubin 0.8 0.3 - 1.2 mg/dL   GFR calc non Af Amer 56 (L) >60  mL/min   GFR calc Af Amer >60 >60 mL/min    Comment: (NOTE) The eGFR has been calculated using the CKD EPI equation. This calculation has not been validated in all clinical situations. eGFR's persistently <60 mL/min signify possible Chronic Kidney Disease.    Anion gap 6 5 - 15  Ethanol     Status: None   Collection Time: 11/11/2015 10:02 AM  Result Value Ref Range   Alcohol, Ethyl (B) <5 <5 mg/dL    Comment:        LOWEST DETECTABLE LIMIT FOR SERUM ALCOHOL IS 5 mg/dL FOR MEDICAL PURPOSES ONLY   Acetaminophen level     Status: Abnormal   Collection Time: 11/22/2015 10:02 AM  Result Value Ref Range   Acetaminophen (Tylenol), Serum <10 (L) 10 - 30 ug/mL    Comment:        THERAPEUTIC CONCENTRATIONS VARY SIGNIFICANTLY. A RANGE OF 10-30 ug/mL MAY BE AN EFFECTIVE CONCENTRATION FOR MANY PATIENTS. HOWEVER, SOME ARE BEST TREATED AT CONCENTRATIONS OUTSIDE THIS RANGE. ACETAMINOPHEN CONCENTRATIONS >150 ug/mL AT 4 HOURS AFTER INGESTION AND >50 ug/mL AT 12 HOURS AFTER INGESTION ARE OFTEN ASSOCIATED WITH TOXIC REACTIONS.   Salicylate level     Status: None   Collection Time: 11/25/2015 10:02 AM  Result Value Ref Range   Salicylate Lvl <9.5 2.8 - 30.0 mg/dL  D-dimer, quantitative     Status: Abnormal   Collection Time: 12/02/2015 10:02 AM  Result Value Ref Range   D-Dimer, Quant 1.80 (H) 0.00 - 0.50 ug/mL-FEU    Comment: (NOTE) At the manufacturer cut-off of 0.50 ug/mL FEU, this assay has been documented to exclude PE with a sensitivity and negative predictive value of 97 to 99%.  At this time, this assay has not been approved by the FDA to exclude DVT/VTE. Results should be correlated with clinical presentation.   Brain natriuretic peptide     Status: Abnormal   Collection Time: 12/02/2015 10:02 AM  Result Value Ref Range   B Natriuretic Peptide 154.5 (H) 0.0 - 100.0 pg/mL  I-stat troponin, ED     Status: None   Collection Time: 12/05/2015 10:06 AM  Result Value Ref Range   Troponin  i, poc 0.00 0.00 - 0.08 ng/mL  Comment 3            Comment: Due to the release kinetics of cTnI, a negative result within the first hours of the onset of symptoms does not rule out myocardial infarction with certainty. If myocardial infarction is still suspected, repeat the test at appropriate intervals.   Urine rapid drug screen (hosp performed)     Status: None   Collection Time: 11/26/2015 11:07 AM  Result Value Ref Range   Opiates NONE DETECTED NONE DETECTED   Cocaine NONE DETECTED NONE DETECTED   Benzodiazepines NONE DETECTED NONE DETECTED   Amphetamines NONE DETECTED NONE DETECTED   Tetrahydrocannabinol NONE DETECTED NONE DETECTED   Barbiturates NONE DETECTED NONE DETECTED    Comment:        DRUG SCREEN FOR MEDICAL PURPOSES ONLY.  IF CONFIRMATION IS NEEDED FOR ANY PURPOSE, NOTIFY LAB WITHIN 5 DAYS.        LOWEST DETECTABLE LIMITS FOR URINE DRUG SCREEN Drug Class       Cutoff (ng/mL) Amphetamine      1000 Barbiturate      200 Benzodiazepine   829 Tricyclics       562 Opiates          300 Cocaine          300 THC              50   Urinalysis, Routine w reflex microscopic     Status: Abnormal   Collection Time: 11/11/2015 11:07 AM  Result Value Ref Range   Color, Urine YELLOW YELLOW   APPearance CLOUDY (A) CLEAR   Specific Gravity, Urine 1.022 1.005 - 1.030   pH 5.5 5.0 - 8.0   Glucose, UA NEGATIVE NEGATIVE mg/dL   Hgb urine dipstick MODERATE (A) NEGATIVE   Bilirubin Urine NEGATIVE NEGATIVE   Ketones, ur 15 (A) NEGATIVE mg/dL   Protein, ur NEGATIVE NEGATIVE mg/dL   Nitrite NEGATIVE NEGATIVE   Leukocytes, UA MODERATE (A) NEGATIVE  Urine microscopic-add on     Status: Abnormal   Collection Time: 11/16/2015 11:07 AM  Result Value Ref Range   Squamous Epithelial / LPF 6-30 (A) NONE SEEN   WBC, UA 6-30 0 - 5 WBC/hpf   RBC / HPF 0-5 0 - 5 RBC/hpf   Bacteria, UA FEW (A) NONE SEEN  TSH     Status: None   Collection Time: 11/17/2015  3:18 PM  Result Value Ref Range    TSH 1.116 0.350 - 4.500 uIU/mL  Heparin level (unfractionated)     Status: None   Collection Time: 11/17/2015  7:33 PM  Result Value Ref Range   Heparin Unfractionated 0.52 0.30 - 0.70 IU/mL    Comment:        IF HEPARIN RESULTS ARE BELOW EXPECTED VALUES, AND PATIENT DOSAGE HAS BEEN CONFIRMED, SUGGEST FOLLOW UP TESTING OF ANTITHROMBIN III LEVELS.   Heparin level (unfractionated)     Status: None   Collection Time: 11/12/2015 11:50 PM  Result Value Ref Range   Heparin Unfractionated 0.47 0.30 - 0.70 IU/mL    Comment:        IF HEPARIN RESULTS ARE BELOW EXPECTED VALUES, AND PATIENT DOSAGE HAS BEEN CONFIRMED, SUGGEST FOLLOW UP TESTING OF ANTITHROMBIN III LEVELS.   Heparin level (unfractionated)     Status: None   Collection Time: 11/06/15  3:20 AM  Result Value Ref Range   Heparin Unfractionated 0.48 0.30 - 0.70 IU/mL    Comment:  IF HEPARIN RESULTS ARE BELOW EXPECTED VALUES, AND PATIENT DOSAGE HAS BEEN CONFIRMED, SUGGEST FOLLOW UP TESTING OF ANTITHROMBIN III LEVELS.   CBC     Status: Abnormal   Collection Time: 11/06/15  3:20 AM  Result Value Ref Range   WBC 8.7 4.0 - 10.5 K/uL   RBC 3.89 3.87 - 5.11 MIL/uL   Hemoglobin 12.7 12.0 - 15.0 g/dL   HCT 39.1 36.0 - 46.0 %   MCV 100.5 (H) 78.0 - 100.0 fL   MCH 32.6 26.0 - 34.0 pg   MCHC 32.5 30.0 - 36.0 g/dL   RDW 12.5 11.5 - 15.5 %   Platelets 160 150 - 400 K/uL  Basic metabolic panel     Status: Abnormal   Collection Time: 11/06/15  3:20 AM  Result Value Ref Range   Sodium 142 135 - 145 mmol/L   Potassium 3.8 3.5 - 5.1 mmol/L   Chloride 108 101 - 111 mmol/L   CO2 27 22 - 32 mmol/L   Glucose, Bld 110 (H) 65 - 99 mg/dL   BUN 9 6 - 20 mg/dL   Creatinine, Ser 0.72 0.44 - 1.00 mg/dL   Calcium 9.0 8.9 - 10.3 mg/dL   GFR calc non Af Amer >60 >60 mL/min   GFR calc Af Amer >60 >60 mL/min    Comment: (NOTE) The eGFR has been calculated using the CKD EPI equation. This calculation has not been validated in all clinical  situations. eGFR's persistently <60 mL/min signify possible Chronic Kidney Disease.    Anion gap 7 5 - 15  Glucose, capillary     Status: Abnormal   Collection Time: 11/06/15  6:54 AM  Result Value Ref Range   Glucose-Capillary 113 (H) 65 - 99 mg/dL   Comment 1 Notify RN    Comment 2 Document in Chart   Glucose, capillary     Status: Abnormal   Collection Time: 11/06/15  9:57 PM  Result Value Ref Range   Glucose-Capillary 198 (H) 65 - 99 mg/dL  Glucose, capillary     Status: Abnormal   Collection Time: 11/06/15 10:14 PM  Result Value Ref Range   Glucose-Capillary 319 (H) 65 - 99 mg/dL   Comment 1 Notify RN    Comment 2 Document in Chart   Glucose, capillary     Status: Abnormal   Collection Time: 11/06/15 10:48 PM  Result Value Ref Range   Glucose-Capillary 302 (H) 65 - 99 mg/dL   Comment 1 Notify RN   Blood gas, arterial     Status: Abnormal   Collection Time: 12/05/15 12:10 AM  Result Value Ref Range   FIO2 1.00    Delivery systems VENTILATOR    Mode PRESSURE REGULATED VOLUME CONTROL    VT 360 mL   LHR 18 resp/min   Peep/cpap 5.0 cm H20   pH, Arterial 7.272 (L) 7.350 - 7.450   pCO2 arterial 45.7 (H) 35.0 - 45.0 mmHg   pO2, Arterial 260 (H) 80.0 - 100.0 mmHg   Bicarbonate 20.4 20.0 - 24.0 mEq/L   TCO2 21.8 0 - 100 mmol/L   Acid-base deficit 5.3 (H) 0.0 - 2.0 mmol/L   O2 Saturation 99.2 %   Patient temperature 98.6    Collection site RIGHT RADIAL    Drawn by [phone]    Sample type ARTERIAL DRAW    Allens test (pass/fail) PASS PASS    Ct Head Wo Contrast  Result Date: 11/17/2015 CLINICAL DATA:  Loss of consciousness. Syncopal episode/seizure while sitting  at desk at work. EXAM: CT HEAD WITHOUT CONTRAST TECHNIQUE: Contiguous axial images were obtained from the base of the skull through the vertex without intravenous contrast. COMPARISON:  None. FINDINGS: There is no evidence of acute cortical infarct, intracranial hemorrhage, mass, midline shift, or extra-axial fluid  collection. Ventricles and sulci are normal. Cerebral white matter hypodensities are nonspecific but compatible with mild chronic small vessel ischemic disease. Bilateral cataract extraction in right scleral buckle are noted. There is right parietal scalp swelling. The visualized paranasal sinuses and mastoid air cells are clear. No skull fracture is identified. IMPRESSION: 1. No evidence of acute intracranial abnormality. 2. Right parietal scalp swelling. 3. Mild chronic small vessel ischemic disease. Electronically Signed   By: Logan Bores M.D.   On: 12/02/2015 10:33   Ct Angio Chest Pe W And/or Wo Contrast  Result Date: 11/08/2015 CLINICAL DATA:  Loss of consciousness.  Syncope versus seizure. EXAM: CT ANGIOGRAPHY CHEST WITH CONTRAST TECHNIQUE: Multidetector CT imaging of the chest was performed using the standard protocol during bolus administration of intravenous contrast. Multiplanar CT image reconstructions and MIPs were obtained to evaluate the vascular anatomy. CONTRAST:  100 mL Isovue 370 COMPARISON:  Chest radiograph 05/22/2004 FINDINGS: Mediastinum/Lymph Nodes: Pulmonary arterial opacification is adequate, however there is mild motion artifact diffusely which limits subsegmental evaluation. There is a single apparent filling defect involving a distal segmental artery at its branch point in the anterior basal right lower lobe (series 7, image 117). No other definite emboli are identified, and there is no central embolus. No masses or pathologically enlarged lymph nodes identified. Lungs/Pleura: Evaluation of the lung parenchyma is mildly limited by respiratory motion artifact. There is mild dependent atelectasis in the lung bases. No lung mass or pleural effusion. Major airways are patent. Upper abdomen: 7 mm hypodensity in the left hepatic lobe, likely a cyst. Small sliding hiatal hernia. Musculoskeletal: Old lateral right ninth and tenth rib fractures. Moderate thoracic dextroscoliosis with mild  multilevel disc degeneration. Review of the MIP images confirms the above findings. IMPRESSION: Suspected single distal segmental pulmonary embolus in the right lower lobe. No central emboli. These results were called by telephone at the time of interpretation on 11/23/2015 at 12:29 pm to Dr. Justine Null, who verbally acknowledged these results. Electronically Signed   By: Logan Bores M.D.   On: 11/06/2015 12:30   Dg Chest Port 1 View  Result Date: 11/06/2015 CLINICAL DATA:  Code pleural. Status post intubation. "Walking corpse" syndrome. EXAM: PORTABLE CHEST 1 VIEW COMPARISON:  Chest CT yesterday. FINDINGS: Endotracheal tube is 2.3 cm from the carina. Lung volumes are low. There is crowding of bronchovascular structures. Heart is prominent size accentuated by low lung volumes. No pneumothorax, large pleural effusion or focal airspace disease. Multiple overlying monitoring devices partially obscure evaluation. There is gaseous gastric distention. IMPRESSION: 1. Endotracheal tube 2.3 cm from the carina. 2. Low lung volumes with bronchovascular crowding. Electronically Signed   By: Jeb Levering M.D.   On: 11/06/2015 22:41    Review of Systems  Unable to perform ROS: Patient unresponsive   Blood pressure 90/62, pulse (!) 58, temperature (!) 96.8 F (36 C), temperature source Axillary, resp. rate 16, weight 73.7 kg (162 lb 8 oz), SpO2 100 %. Physical Exam  Neurological: GCS eye subscore is 1. GCS verbal subscore is 1. GCS motor subscore is 2.  Patient is comatose pupils fixed and dilated no corneals no gag weak extensor posturing bilaterally    Assessment/Plan: 65 year old female with a large right  acute subdural hematoma with minimal neurologic exam pupils fixed and no corneals no gag very weak extensor posturing. This is not compatible with functional survival. In the setting of very poor neurologic function large acute subdural hematoma as well as antebrachial hemorrhage in the posterior fossa and  brainstem I recommend supportive care only.  CRAM,GARY P 12/02/2015, 1:37 AM

## 2015-12-06 NOTE — Progress Notes (Signed)
RN spoke with ethics, if 2 MDs agree and friends agree then ok to withdraw.  Friends got in contact with a distant cousin who is hospitalized in [place] currently and she was ok with whatever decisions the friends make.  Spoke with friends and Dr. Erlinda Hong from neurology, ok to withdraw.  All questions answered.  Will start morphine drip.  The patient is critically ill with multiple organ systems failure and requires high complexity decision making for assessment and support, frequent evaluation and titration of therapies, application of advanced monitoring technologies and extensive interpretation of multiple databases.   Critical Care Time devoted to patient care services described in this note is  50  Minutes. This time reflects time of care of this signee Dr Jennet Maduro. This critical care time does not reflect procedure time, or teaching time or supervisory time of PA/NP/Med student/Med Resident etc but could involve care discussion time.  Rush Farmer, M.D. [facility] Pulmonary/Critical Care Medicine. Pager: [phone]. After hours pager: [phone].

## 2015-12-06 NOTE — Progress Notes (Signed)
Pt has received poor prognosis r/t large brain bleed with minimal reflexes (only gag present). Pt has no family contacts or an AD. Parents, siblings, aunts/uncles all deceased and pt was never married. Pt has a long term friend, Manuela Schwartz, who the pt has appointed as an emergency contact and as a representative to pick up medical records, receive medical information, etc.   Ethics committee consulted for next steps. CCM and neuro are in agreement that further treatment is futile. Per ethics, if the team is in agreement of treatment plan and the friend (susan) is also in agreement, [place] law allows for the friend to make medical decisions. If the friend is not in agreement, then more formal, legal processes are indicated.   Lorre Munroe

## 2015-12-06 NOTE — Progress Notes (Signed)
STROKE TEAM PROGRESS NOTE   HISTORY OF PRESENT ILLNESS (per record) [name] is an 65 y.o. female with medical history significant for EtOH abuse, A. fib on Cardizem and aspirin. Arrived at ED with chief complaint of seizure/syncope. Patient states she was at her desk when suddenly she lost consciousness and the next thing she realizes is that she was on the ground with EMS. She had no warning or dizziness or tunnel vision. She was told she was out for 2 minutes. There is some report of ? Seizure but event was unwitnessed. She denies Hx of Seizure, Trauma, head trauma, stroke, febrile seizure, abnormal birthing process or family history of Seizure. Currently she is back to baseline. EEG was obtained and this  Showed no abnormality.   Called by [facility] 11/17/2015 after midnight due to patient being unresponsive after code. She was found unresponsive and a code was called. She received CPR briefly, but it was unclear how long she was in a state of agonal respirations/hypotension prior to being found(LKW 20:45). Seen again at 21:50 unresponsive. CT brain showed large R SDH and posterior fossa IPH with L ventricle and R temporal horn entrapment.    SUBJECTIVE (INTERVAL HISTORY) Her co-worker (friends x 30 years) is at the bedside. Patient not married, no kids, siblings and parents deceased. Friend attempting to find cousins in the [place]. No will per friend - they were going to write when she stopped working. RN to call [employer] to see who is listed as emergency contact.    OBJECTIVE Temp:  [96.3 F (35.7 C)-99.3 F (37.4 C)] 97.5 F (36.4 C) (08/03 0812) Pulse Rate:  [48-81] 58 (08/03 0800) Cardiac Rhythm: Sinus tachycardia (08/03 0800) Resp:  [11-26] 17 (08/03 0800) BP: (62-154)/(46-134) 96/57 (08/03 0800) SpO2:  [97 %-100 %] 99 % (08/03 0800) FiO2 (%):  [40 %-100 %] 40 % (08/03 0800) Weight:  [69.6 kg (153 lb 7 oz)] 69.6 kg (153 lb 7 oz) (08/03 0100)  CBC:  Recent Labs Lab 11/25/2015 1002  11/06/15 0320 11/23/15 0227  WBC 11.3* 8.7 20.9*  NEUTROABS 10.1*  --   --   HGB 13.9 12.7 12.4  HCT 41.8 39.1 37.9  MCV 100.5* 100.5* 101.3*  PLT 169 160 [id number]    Basic Metabolic Panel:  Recent Labs Lab 11/06/15 0320 Nov 23, 2015 0227  NA 142 139  K 3.8 3.4*  CL 108 111  CO2 27 24  GLUCOSE 110* 165*  BUN 9 6  CREATININE 0.72 0.72  CALCIUM 9.0 7.8*    Lipid Panel:    Component Value Date/Time   CHOL 259 (H) 10/16/2015 0807   TRIG 106.0 10/16/2015 0807   HDL 85.50 10/16/2015 0807   CHOLHDL 3 10/16/2015 0807   VLDL 21.2 10/16/2015 0807   LDLCALC 152 (H) 10/16/2015 0807   HgbA1c:  Lab Results  Component Value Date   HGBA1C 5.0 10/23/2015   Urine Drug Screen:    Component Value Date/Time   LABOPIA NONE DETECTED 11/29/2015 1107   COCAINSCRNUR NONE DETECTED 11/16/2015 1107   LABBENZ NONE DETECTED 11/16/2015 1107   AMPHETMU NONE DETECTED 11/15/2015 1107   THCU NONE DETECTED 11/30/2015 1107   LABBARB NONE DETECTED 11/06/2015 1107     IMAGING I have personally reviewed the radiological images below and agree with the radiology interpretations.  Ct Head Wo Contrast 2015-11-23 30 x 24 mm posterior fossa hemorrhage concerning for parenchymal hematoma, less likely intraventricular blood products. Cerebellar herniation. Large RIGHT subdural hematoma resulting in 15 mm  RIGHT to LEFT midline shift. LEFT ventricle and RIGHT temporal horn entrapment.   Ct Head Wo Contrast 11/29/2015 1. No evidence of acute intracranial abnormality. 2. Right parietal scalp swelling. 3. Mild chronic small vessel ischemic disease.   Ct Angio Chest Pe W And/or Wo Contrast 11/06/2015  Suspected single distal segmental pulmonary embolus in the right lower lobe. No central emboli.   Dg Chest Port 1 View  November 08, 2015 1. Tip and side port of the enteric tube below the diaphragm in the stomach. 2. Endotracheal tube in place. 3. Minimal right basilar atelectasis.   11/06/2015  1. Endotracheal tube 2.3 cm from  the carina. 2. Low lung volumes with bronchovascular crowding.   2D echocardiogram - Procedure narrative: Transthoracic echocardiography. Image quality was adequate. The study was technically difficult. - Left ventricle: The cavity size was normal. Systolic function was normal. The estimated ejection fraction was in the range of 55% to 60%. Wall motion was normal; there were no regional wall motion abnormalities. Left ventricular diastolic function parameters were normal. - Atrial septum: No defect or patent foramen ovale was identified.   PHYSICAL EXAM  Temp:  [96.3 F (35.7 C)-98.9 F (37.2 C)] 98.3 F (36.8 C) (08/03 1114) Pulse Rate:  [38-77] 50 (08/03 1400) Resp:  [0-26] 0 (08/03 1400) BP: (62-154)/(46-134) 103/56 (08/03 1300) SpO2:  [23 %-100 %] 23 % (08/03 1400) FiO2 (%):  [40 %-100 %] 40 % (08/03 1200) Weight:  [153 lb 7 oz (69.6 kg)] 153 lb 7 oz (69.6 kg) (08/03 0100)  General - Well nourished, well developed, intubated and not on sedation.  Ophthalmologic - Fundi not visualized due to noncooperation.  Cardiovascular - Regular rate and rhythm.  Neuro - coma, not responsive, intubated and not on sedation. Pupils fixed, 47mm equal but no papillary reflexes, no corneal reflexed, positive gag. Breathing over the vent, on pain stimulation, no movement BUEs but mild withdraw BLEs. No babinski, DTR 1+.   ASSESSMENT/PLAN [name] is a 65 y.o. female with history of EtOH abuse and A. fib  admitted with possible seizure (fell out at work). In hopsital found to have PE, placed on IV heparin. Found unresponsive by nurse with resultant CPR. Now with large SDH, posterior fossa IPH.   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 Etiology unclear, questionable related to heparin drip in the setting after a fall earlier.  Resultant  VDRF, comatose  Neurosurgery consult - no surgical  intervention warranted  CT head on admission no intracranial abnormality, R parietal scalp swelling, small vessel disease   CT head after CPR posterior fossa IPH with cerebellar herniation, Large R SDH with 50mm R to L shift, L ventricle and R temporal horn entrapment.   2D Echo  EF 55-60%. No source of embolus   LDL 152 in July  HgbA1c 5.0  SCDs for VTE prophylaxis  aspirin 325 mg daily prior to admission, now on No antithrombotic given hemorrhage  Neurologic prognosis is grim with unlikely survival. Friend at bedside aware. Attempting to contact cousins in the [place]. She is willing to participate in medical decision making with her friend for pt.  Disposition:  pending - likely comfort care after ethic committee discussion  Acute Respiratory Failure  Intubated  unresponsive  Seizure vs syncope  Happened at work  Seemed sustained a fall with right parietal scalp swelling  Work up pending  Pulmonary Embolus RLL  Placed on  IV heparin   LE doppler pending   IV heparin stopped due to hemorrhage  Shock post PEA arrest/Respiratory Arrest  S/p CPR  Atrial Fibrillation  Home anticoagulation:  None  Follows with cardiology - not on AC at home  Home meds including cardizem and flecanide  Not an anticoagulation candidate secondary to hemorrhage   Hx Hypertension, now hypotensive 100s Increase NS to 50cc/hr SBP goal < 160 given hemorrhage  Hyperlipidemia  Home meds:  crestor 10, initially resumed in hospital  LDL 152, goal < 70  Hold off crestor due to bleeding at this time  Other Stroke Risk Factors  ETOH abuse  Overweight, Body mass index is 27.18 kg/m., recommend weight loss, diet and exercise as appropriate   Family hx stroke (maternal grandfather)  Other Active Problems  Suspected aspiration PNA  Hospital day # [place] for Pager information Nov 09, 2015 3:01 PM   This patient is critically ill due  to large SDH and IPH/IVH, cardiac arrest, coma, PE, intubation and at significant risk of neurological worsening, death form brain herniation, recurrent stroke, brain death. This patient's care requires constant monitoring of vital signs, hemodynamics, respiratory and cardiac monitoring, review of multiple databases, neurological assessment, discussion with family, other specialists and medical decision making of high complexity. I spent 35 minutes of neurocritical care time in the care of this patient.  Reviewed image and examined pt. Her pupils dilated and fixed, no papillary reflex, no corneal, but positive gag. On pain stimulation, mild withdraw both LEs. Not on sedation at all since beginning. CT showed large SDH and 4th ventricle bleeding with extensive midline shift and uncal herniation. NSG no surgical intervention. Pt likely not survivable. Exteremly poor prognosis. Discussed with Dr. Drinda Butts and I agree with withdraw care, comfort care measures based on clinical findings and very poor prognosis.   Rosalin Hawking, MD PhD Stroke Neurology 11/09/2015 3:17 PM     To contact Stroke Continuity provider, please refer to [email or web address] After hours, contact General Neurology

## 2015-12-06 NOTE — Progress Notes (Signed)
   11-08-15 1000  Clinical Encounter Type  Visited With Patient;Family  Visit Type Initial  Spiritual Encounters  Spiritual Needs Prayer;Emotional  Stress Factors  Patient Stress Factors Exhausted;Health changes  Family Stress Factors Exhausted;Health changes   Chaplain doing rounds on floor and visited with pt.  Family present at bedside.  Chaplain able to offer ministry of presence and prayed with pt and family.  Vilinda Blanks Celso Granja 11-08-15 10:46 AM

## 2015-12-06 NOTE — Progress Notes (Signed)
PULMONARY / CRITICAL CARE MEDICINE   Name: [name] MRN: [id number] DOB: [birth year]    ADMISSION DATE:  11/17/2015 CONSULTATION DATE:  11/06/15  REFERRING MD:  Dr Tana Coast, TRH  CHIEF COMPLAINT:  Acute respiratory failure / respiratory arrest  HISTORY OF PRESENT ILLNESS:   65 yo woman, hx EtOH abuse, A Fib (not on anticoagulation). She suffered a syncopal episode versus seizure on 12/02/2015 prompting admission. Per notes she has continued to drink usual 5 beers a day. Part of the evaluation included Ct-PA that showed a subsegmental RLL PE for which she was started on anticoagulation. Evening of 8/2 was found unresponsive with emesis present, stridorous respirations. Last seen normal ~ 55 minutes before. Became pulseless > underwent CPR for 30 seconds, airway placed and she had ROSC. Transfers now to ICU for further care. Per RN she has not required any prn ativan, has not been agitated. No tonic-clonic activity reported with this event. On arrival unresponsive, hypotensive.   SUBJECTIVE: Arrest overnight, neuro status very poor per neuro and neurosurg.  VITAL SIGNS: BP (!) 98/51   Pulse (!) 38   Temp 97.5 F (36.4 C) (Oral)   Resp 19   Wt 69.6 kg (153 lb 7 oz)   SpO2 100%   BMI 27.18 kg/m    HEMODYNAMICS:    VENTILATOR SETTINGS: Vent Mode: PRVC FiO2 (%):  [40 %-100 %] 40 % Set Rate:  [18 bmp] 18 bmp Vt Set:  [360 mL] 360 mL PEEP:  [5 cmH20] 5 cmH20 Plateau Pressure:  [12 cmH20-16 cmH20] 13 cmH20  INTAKE / OUTPUT: I/O last 3 completed shifts: In: 1440.5 [I.V.:1190.5; NG/GT:50; IV Piggyback:200] Out: 4615 [Urine:4615]  PHYSICAL EXAMINATION: General:  Ill appearing woman, intubated and unresponsive (off sedation) Neuro:  Unresponsive to pain, voice, any stimulation, only reflexes are gag and a respiratory drive HEENT:  Pupils 4-47mm and non-reactive, ETT in place Cardiovascular:  Regular, no M Lungs:  Clear B  Abdomen:  Obese, soft, positive BS Musculoskeletal:  No  deformities or edema Skin:  No rash, cool extremities  LABS:  BMET  Recent Labs Lab 11/14/2015 1002 11/06/15 0320 12/01/15 0227  NA 140 142 139  K 4.4 3.8 3.4*  CL 109 108 111  CO2 25 27 24   BUN 13 9 6   CREATININE 1.04* 0.72 0.72  GLUCOSE 131* 110* 165*   Electrolytes  Recent Labs Lab 12/02/2015 1002 11/06/15 0320 01-Dec-2015 0227  CALCIUM 9.7 9.0 7.8*   CBC  Recent Labs Lab 11/13/2015 1002 11/06/15 0320 2015/12/01 0227  WBC 11.3* 8.7 20.9*  HGB 13.9 12.7 12.4  HCT 41.8 39.1 37.9  PLT 169 160 161    Coag's No results for input(s): APTT, INR in the last 168 hours.  Sepsis Markers No results for input(s): LATICACIDVEN, PROCALCITON, O2SATVEN in the last 168 hours.  ABG  Recent Labs Lab 12/01/15 0010  PHART 7.272*  PCO2ART 45.7*  PO2ART 260*   Liver Enzymes  Recent Labs Lab 11/19/2015 1002  AST 41  ALT 22  ALKPHOS 70  BILITOT 0.8  ALBUMIN 4.0   Cardiac Enzymes  Recent Labs Lab 12/01/2015 0227 December 01, 2015 0347  TROPONINI 0.67* 0.08*   Glucose  Recent Labs Lab 11/06/15 0654 11/06/15 2157 11/06/15 2214 11/06/15 2248 2015/12/01 0809  GLUCAP 113* 198* 319* 302* 125*   Imaging Ct Head Wo Contrast  Result Date: 12-01-15 CLINICAL DATA:  Altered mental status, unresponsive. On heparin for pulmonary embolism. Recent syncopal episode. EXAM: CT HEAD WITHOUT CONTRAST TECHNIQUE: Contiguous axial images  were obtained from the base of the skull through the vertex without intravenous contrast. COMPARISON:  None. FINDINGS: INTRACRANIAL CONTENTS: Heterogeneously dense RIGHT holo hemispheric subdural hematoma measuring up to 18 mm, resulting in 15 mm RIGHT to LEFT midline shift. RIGHT ventricular effacement with the LEFT ventricle entrapment. RIGHT temporal horn entrapment. 30 x 24 mm central hematoma within the posterior fossa, likely effacing the fourth ventricle, less likely intraventricular hemorrhage with trace amount of presumed blood products at the obex. Diffuse  effacement of the basal cisterns, with upward and downward herniation of the cerebellum. No acute large vascular territory infarct. ORBITS: Status post bilateral ocular lens implants and RIGHT scleral banding. SINUSES: Mild ethmoid mucosal thickening. Mastoid air cells are well aerated. SKULL/SOFT TISSUES: Small RIGHT parietal scalp hematoma without subcutaneous gas or radiopaque foreign bodies. No skull fracture. Severe RIGHT temporal mandibular osteoarthrosis. IMPRESSION: 30 x 24 mm posterior fossa hemorrhage concerning for parenchymal hematoma, less likely intraventricular blood products. Cerebellar herniation. Large RIGHT subdural hematoma resulting in 15 mm RIGHT to LEFT midline shift. LEFT ventricle and RIGHT temporal horn entrapment. Acute findings discussed with and reconfirmed by Dr[facility] on 11-30-2015 at 1:15 am. Electronically Signed   By: Elon Alas M.D.   On: 2015/11/30 02:56   Dg Chest Port 1 View  Result Date: November 30, 2015 CLINICAL DATA:  Encounter for imaging study to confirm orogastric tube placement. Endotracheal tube. EXAM: PORTABLE CHEST 1 VIEW COMPARISON:  Chest radiograph yesterday at 2219 hour FINDINGS: Endotracheal tube 1.9 cm from the carina. Enteric tube in place, tip and side port below the diaphragm in the stomach. Low lung volumes persist. There is persistent but decreased bronchovascular crowding. Cardiomegaly is stable. Minimal right basilar atelectasis. Limited assessment of the left lung base. IMPRESSION: 1. Tip and side port of the enteric tube below the diaphragm in the stomach. 2. Endotracheal tube in place. 3. Minimal right basilar atelectasis. Electronically Signed   By: Jeb Levering M.D.   On: 11/30/2015 01:46   Dg Chest Port 1 View  Result Date: 11/06/2015 CLINICAL DATA:  Code pleural. Status post intubation. "Walking corpse" syndrome. EXAM: PORTABLE CHEST 1 VIEW COMPARISON:  Chest CT yesterday. FINDINGS: Endotracheal tube is 2.3 cm from the carina.  Lung volumes are low. There is crowding of bronchovascular structures. Heart is prominent size accentuated by low lung volumes. No pneumothorax, large pleural effusion or focal airspace disease. Multiple overlying monitoring devices partially obscure evaluation. There is gaseous gastric distention. IMPRESSION: 1. Endotracheal tube 2.3 cm from the carina. 2. Low lung volumes with bronchovascular crowding. Electronically Signed   By: Jeb Levering M.D.   On: 11/06/2015 22:41     STUDIES:  CT-PA 8/1 >> single distal RLL subsegmental PE, mild B dependent atelectasis TTE 8/2 >> normal LV fxn, normal RV fxn  CULTURES: Urine 8/2 >>   ANTIBIOTICS: Unasyn 8/2 >>   SIGNIFICANT EVENTS:   LINES/TUBES: ETT 8/2 >>   DISCUSSION: 65 yo w EtOH abuse, admitted following syncope vs possible (first ever) seizure. Noted to have a small RLL PE and started on heparin. Found unresponsive progressing to resp arrest and brief pulselessness. Etiology unclear but consider emesis event with UA occlusion, recurrent seizure activity (no evidence tonic-clonic activity reported)  ASSESSMENT / PLAN:  PULMONARY A: Acute respiratory failure with hypoxemia, also due to inability to protect airway RLL subsegmental PE P:   Full vent support. D/C heparin and ASA.  CARDIOVASCULAR A:  Shock post PEA arrest / resp arrest A Fib Hyperlipidemia  P:  Tele monitoring. DNR.  RENAL A:   No issues P:   D/C blood draws.  GASTROINTESTINAL A:   SUP Nutrition P:   D/C pepcid No TF given grim neuro prognosis.  HEMATOLOGIC A:   No acute issues P:  D/C blood draws.  INFECTIOUS A:   Suspected aspiration event, aspiration PNA P:   D/C unasyn, futile.  ENDOCRINE A:   Hyperglycemia  P:   ISS CBG  NEUROLOGIC A:   Syncopal event versus Seizure (new if present) Acute encephalopathy, ? Due to occult status epilepticus, ? Toxic metabolic or anoxic with resp compriomise for unclear period of time. At  risk for acute bleed EtOH abuse P:   D/C sedation. D/C all medications.  FAMILY  - Updates: No family bedside, friends updated.  Patient has no family evidently.  Will involve ethics as prognosis is grim.  Made DNR overnight.  - Inter-disciplinary family meet or Palliative Care meeting due by: 11/13/15  The patient is critically ill with multiple organ systems failure and requires high complexity decision making for assessment and support, frequent evaluation and titration of therapies, application of advanced monitoring technologies and extensive interpretation of multiple databases.   Critical Care Time devoted to patient care services described in this note is  35  Minutes. This time reflects time of care of this signee Dr Jennet Maduro. This critical care time does not reflect procedure time, or teaching time or supervisory time of PA/NP/Med student/Med Resident etc but could involve care discussion time.  Rush Farmer, M.D. [facility] Pulmonary/Critical Care Medicine. Pager: [phone]. After hours pager: [phone].

## 2015-12-06 NOTE — Progress Notes (Signed)
CRITICAL VALUE ALERT  Critical value received:  troponin  Date of notification:  2015/12/06  Time of notification:  0500  Critical value read back:Yes.    Nurse who received alert:  Jannifer Franklin, RN  MD notified (1st page):    Time of first page:    MD notified (2nd page):  Time of second page:  Responding MD:  Dr Mortimer Fries  Time MD responded:  0500

## 2015-12-06 DEATH — deceased

## 2015-12-20 ENCOUNTER — Ambulatory Visit: Payer: BC Managed Care – PPO | Admitting: Internal Medicine

## 2016-04-24 ENCOUNTER — Ambulatory Visit: Payer: BC Managed Care – PPO | Admitting: Internal Medicine

## 2016-10-22 ENCOUNTER — Ambulatory Visit (INDEPENDENT_AMBULATORY_CARE_PROVIDER_SITE_OTHER): Payer: BC Managed Care – PPO | Admitting: Ophthalmology

## 2017-09-17 IMAGING — CR DG KNEE COMPLETE 4+V*L*
4 series · 4 of 4 positions shown · non-contrast
Comparison: None.

CLINICAL DATA: Fell on [REDACTED] the with pain and contusion
laterally

EXAM:
LEFT KNEE - COMPLETE 4+ VIEW

[w knee ap left]
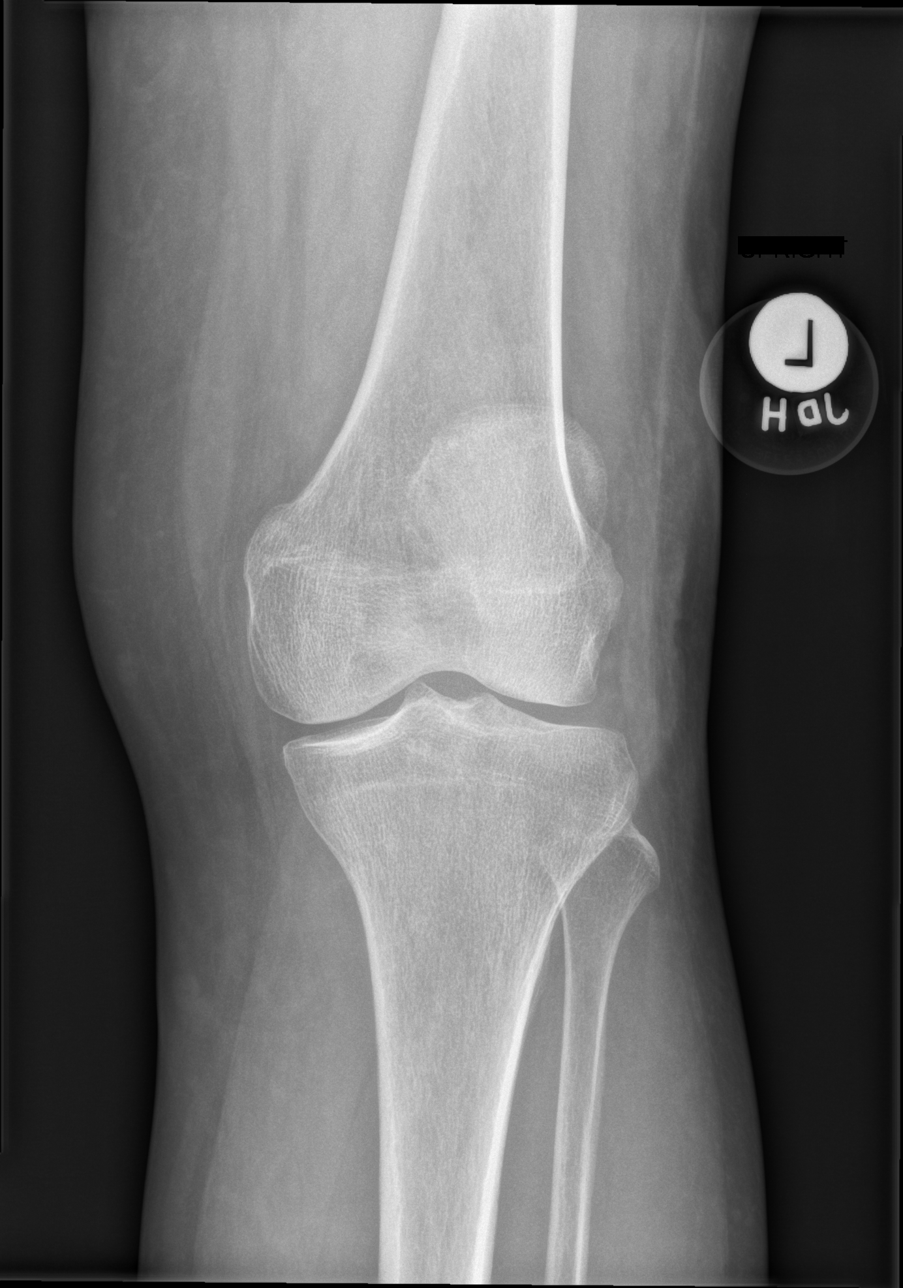

[w knee lat left]
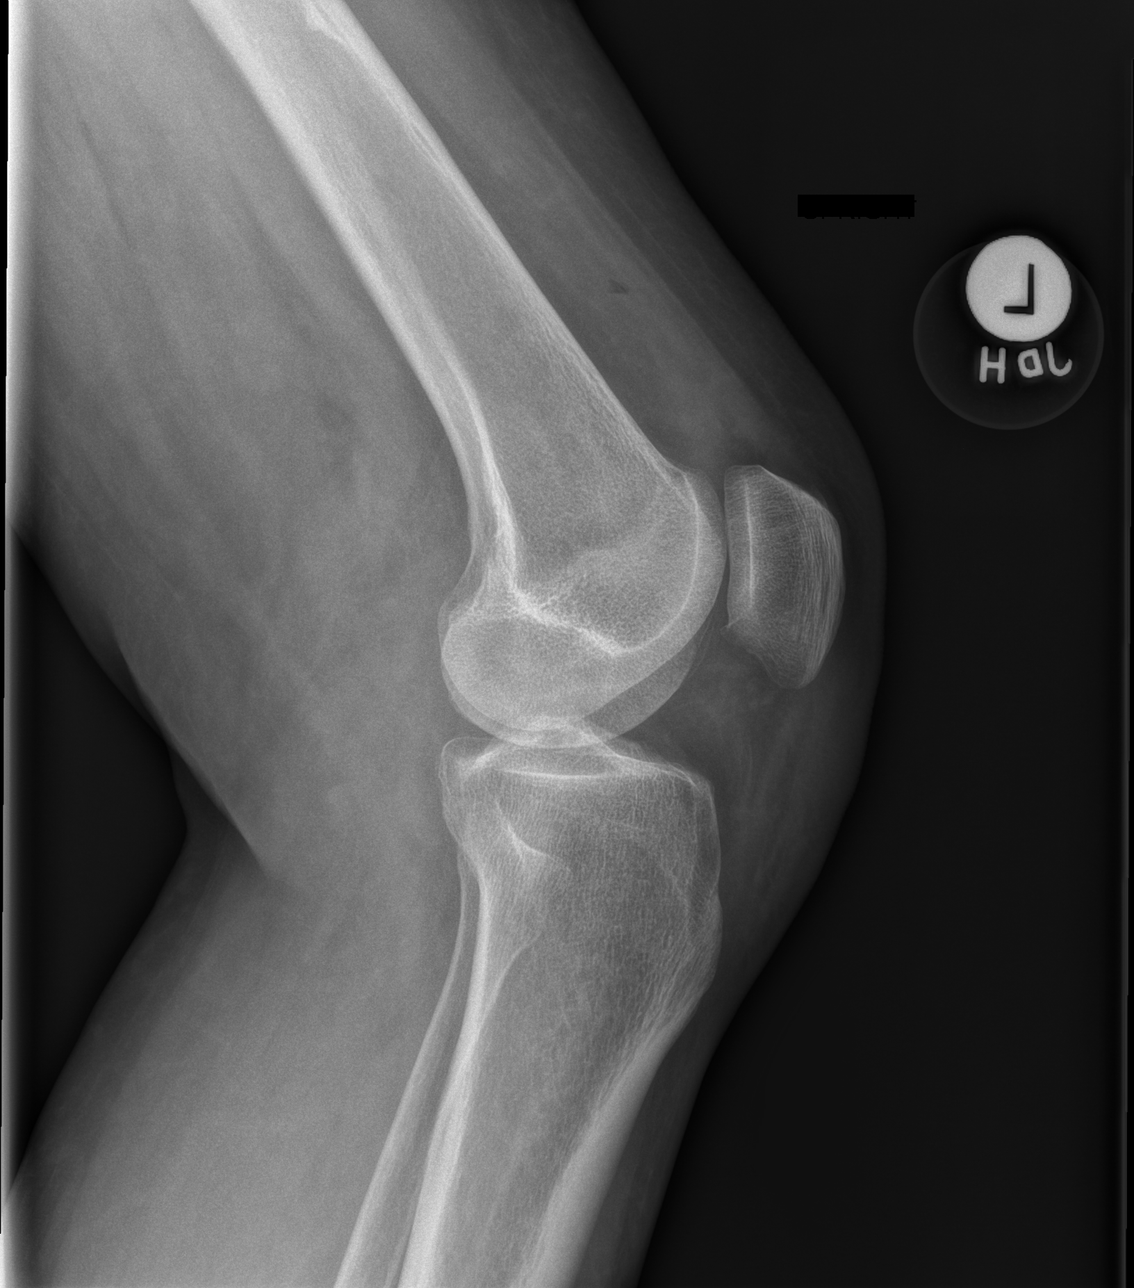

[x knee tunnel left]
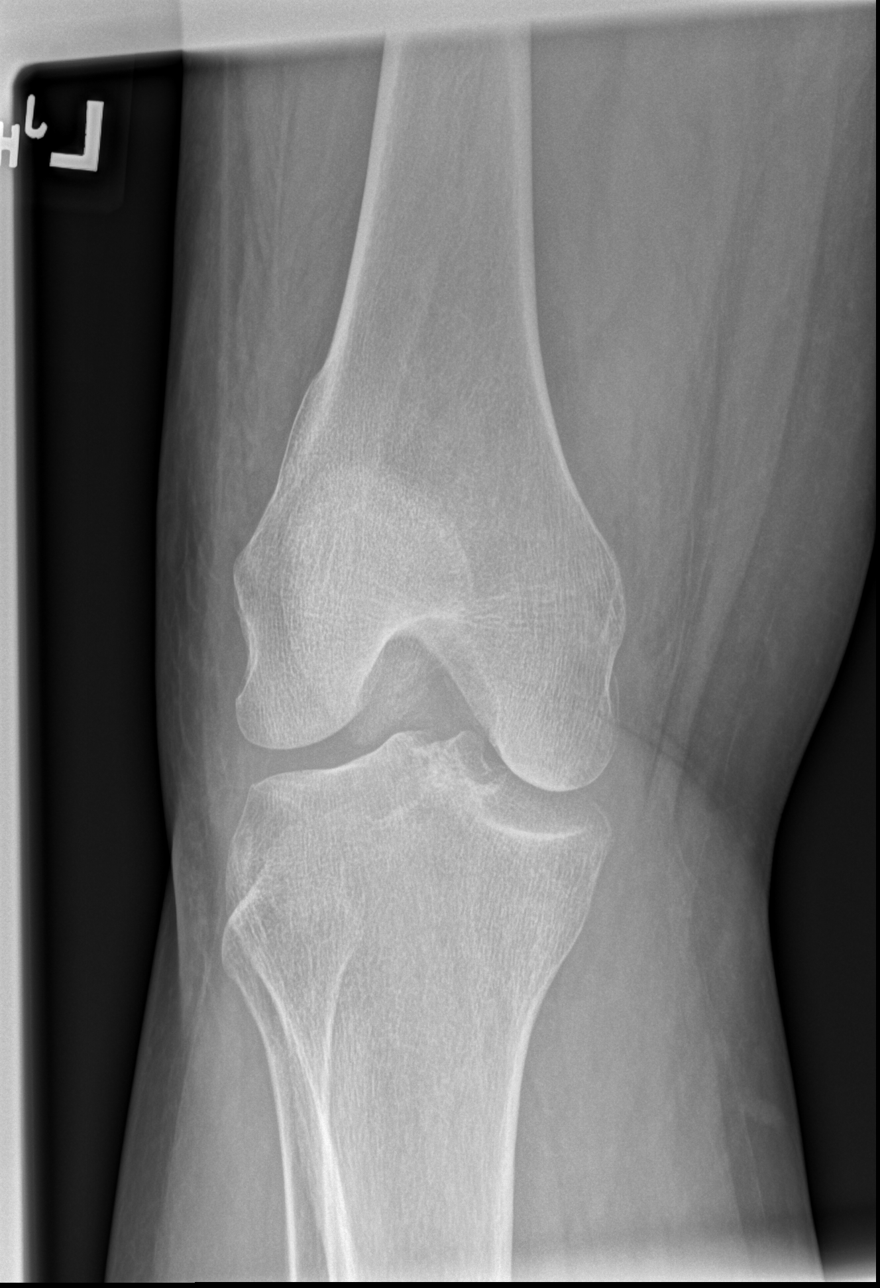

[x knee sunrise left]
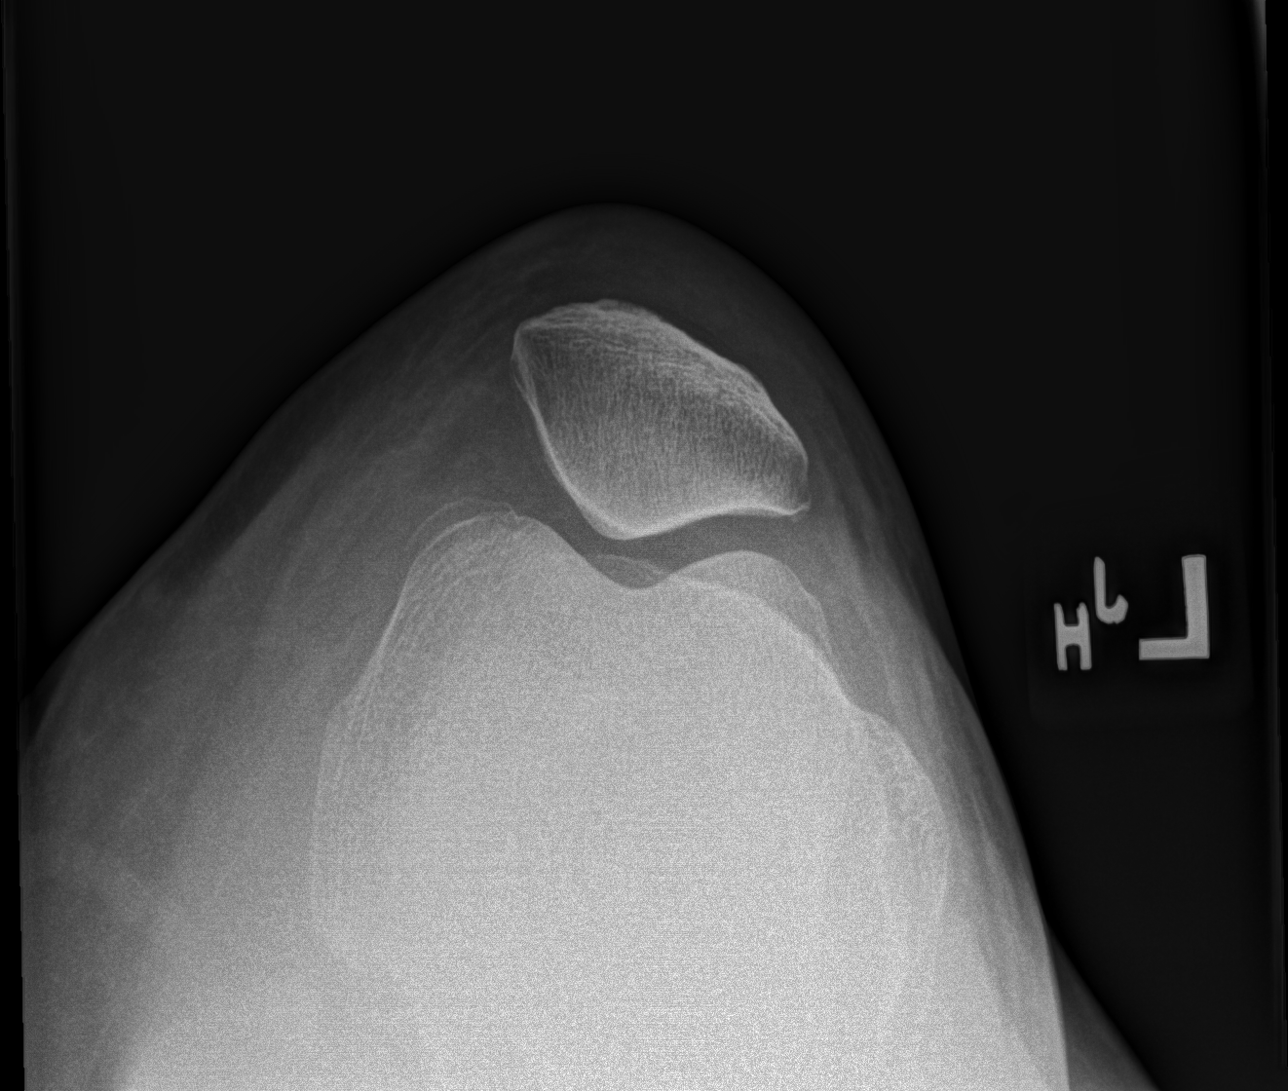

[4 of 4 positions shown; findings below may reference images not displayed]

FINDINGS: No fracture is seen. The joint spaces are relatively well preserved
for age. There may be a tiny amount of joint fluid present. The
patella appears intact.
IMPRESSION: No acute abnormality. Question tiny amount of left knee joint fluid.
